# Patient Record
Sex: Male | Born: 1950 | Race: Black or African American | Hispanic: No | Marital: Married | Smoking: Former smoker
Health system: Southern US, Community
[De-identification: ages and names within clinical notes are randomized; demographics above are authoritative.]

## PROBLEM LIST (undated history)

## (undated) DIAGNOSIS — I714 Abdominal aortic aneurysm, without rupture, unspecified: Secondary | ICD-10-CM

## (undated) DIAGNOSIS — I10 Essential (primary) hypertension: Secondary | ICD-10-CM

## (undated) HISTORY — DX: Abdominal aortic aneurysm, without rupture, unspecified: I71.40

## (undated) HISTORY — DX: Abdominal aortic aneurysm, without rupture: I71.4

## (undated) HISTORY — PX: ABDOMINAL SURGERY: SHX537

---

## 2014-08-05 ENCOUNTER — Inpatient Hospital Stay (HOSPITAL_COMMUNITY)
Admission: EM | Admit: 2014-08-05 | Discharge: 2014-08-10 | DRG: 268 | Disposition: A | Discharge status: Home / Self Care | Payer: BC Managed Care – PPO | Attending: Vascular Surgery | Admitting: Vascular Surgery

## 2014-08-05 ENCOUNTER — Emergency Department (HOSPITAL_COMMUNITY): Payer: BC Managed Care – PPO

## 2014-08-05 ENCOUNTER — Encounter (HOSPITAL_COMMUNITY)
Admission: EM | Disposition: A | Discharge status: Home / Self Care | Payer: Self-pay | Source: Home / Self Care | Attending: Vascular Surgery

## 2014-08-05 ENCOUNTER — Encounter (HOSPITAL_COMMUNITY): Payer: Self-pay | Admitting: Emergency Medicine

## 2014-08-05 ENCOUNTER — Ambulatory Visit (HOSPITAL_COMMUNITY): Admit: 2014-08-05 | Payer: Self-pay | Admitting: Interventional Cardiology

## 2014-08-05 DIAGNOSIS — I723 Aneurysm of iliac artery: Secondary | ICD-10-CM | POA: Diagnosis present

## 2014-08-05 DIAGNOSIS — R109 Unspecified abdominal pain: Secondary | ICD-10-CM

## 2014-08-05 DIAGNOSIS — Z419 Encounter for procedure for purposes other than remedying health state, unspecified: Secondary | ICD-10-CM

## 2014-08-05 DIAGNOSIS — I745 Embolism and thrombosis of iliac artery: Secondary | ICD-10-CM | POA: Diagnosis present

## 2014-08-05 DIAGNOSIS — F17210 Nicotine dependence, cigarettes, uncomplicated: Secondary | ICD-10-CM | POA: Diagnosis present

## 2014-08-05 DIAGNOSIS — Z79899 Other long term (current) drug therapy: Secondary | ICD-10-CM

## 2014-08-05 DIAGNOSIS — K661 Hemoperitoneum: Secondary | ICD-10-CM | POA: Diagnosis present

## 2014-08-05 DIAGNOSIS — Z720 Tobacco use: Secondary | ICD-10-CM

## 2014-08-05 DIAGNOSIS — I713 Abdominal aortic aneurysm, ruptured, unspecified: Secondary | ICD-10-CM | POA: Diagnosis present

## 2014-08-05 DIAGNOSIS — I714 Abdominal aortic aneurysm, without rupture: Secondary | ICD-10-CM | POA: Diagnosis present

## 2014-08-05 DIAGNOSIS — N179 Acute kidney failure, unspecified: Secondary | ICD-10-CM | POA: Diagnosis present

## 2014-08-05 LAB — CBC
HCT: 44.5 % (ref 39.0–52.0)
HEMOGLOBIN: 15 g/dL (ref 13.0–17.0)
MCH: 31 pg (ref 26.0–34.0)
MCHC: 33.7 g/dL (ref 30.0–36.0)
MCV: 91.9 fL (ref 78.0–100.0)
Platelets: 223 10*3/uL (ref 150–400)
RBC: 4.84 MIL/uL (ref 4.22–5.81)
RDW: 14.1 % (ref 11.5–15.5)
WBC: 7.7 10*3/uL (ref 4.0–10.5)

## 2014-08-05 LAB — COMPREHENSIVE METABOLIC PANEL
ALT: 28 U/L (ref 0–53)
AST: 25 U/L (ref 0–37)
Albumin: 3.7 g/dL (ref 3.5–5.2)
Alkaline Phosphatase: 80 U/L (ref 39–117)
Anion gap: 14 (ref 5–15)
BUN: 16 mg/dL (ref 6–23)
CO2: 22 meq/L (ref 19–32)
CREATININE: 1.67 mg/dL — AB (ref 0.50–1.35)
Calcium: 9.2 mg/dL (ref 8.4–10.5)
Chloride: 104 mEq/L (ref 96–112)
GFR, EST AFRICAN AMERICAN: 49 mL/min — AB (ref >90)
GFR, EST NON AFRICAN AMERICAN: 42 mL/min — AB (ref >90)
GLUCOSE: 142 mg/dL — AB (ref 70–99)
Potassium: 6.2 mEq/L — ABNORMAL HIGH (ref 3.7–5.3)
Sodium: 140 mEq/L (ref 137–147)
Total Bilirubin: 0.4 mg/dL (ref 0.3–1.2)
Total Protein: 7.1 g/dL (ref 6.0–8.3)

## 2014-08-05 LAB — I-STAT TROPONIN, ED: Troponin i, poc: 0 ng/mL (ref 0.00–0.08)

## 2014-08-05 LAB — URINALYSIS, ROUTINE W REFLEX MICROSCOPIC
Glucose, UA: NEGATIVE mg/dL
HGB URINE DIPSTICK: NEGATIVE
Ketones, ur: 15 mg/dL — AB
NITRITE: NEGATIVE
PROTEIN: 100 mg/dL — AB
Specific Gravity, Urine: 1.027 (ref 1.005–1.030)
UROBILINOGEN UA: 1 mg/dL (ref 0.0–1.0)
pH: 6 (ref 5.0–8.0)

## 2014-08-05 LAB — DIFFERENTIAL
Basophils Absolute: 0 10*3/uL (ref 0.0–0.1)
Basophils Relative: 1 % (ref 0–1)
EOS ABS: 0.2 10*3/uL (ref 0.0–0.7)
EOS PCT: 2 % (ref 0–5)
LYMPHS ABS: 2.5 10*3/uL (ref 0.7–4.0)
Lymphocytes Relative: 33 % (ref 12–46)
MONO ABS: 0.5 10*3/uL (ref 0.1–1.0)
Monocytes Relative: 6 % (ref 3–12)
NEUTROS PCT: 58 % (ref 43–77)
Neutro Abs: 4.6 10*3/uL (ref 1.7–7.7)

## 2014-08-05 LAB — URINE MICROSCOPIC-ADD ON

## 2014-08-05 LAB — I-STAT CHEM 8, ED
BUN: 17 mg/dL (ref 6–23)
CALCIUM ION: 1.13 mmol/L (ref 1.13–1.30)
CREATININE: 1.8 mg/dL — AB (ref 0.50–1.35)
Chloride: 108 mEq/L (ref 96–112)
GLUCOSE: 148 mg/dL — AB (ref 70–99)
HCT: 50 % (ref 39.0–52.0)
Hemoglobin: 17 g/dL (ref 13.0–17.0)
POTASSIUM: 5.9 meq/L — AB (ref 3.7–5.3)
Sodium: 141 mEq/L (ref 137–147)
TCO2: 22 mmol/L (ref 0–100)

## 2014-08-05 SURGERY — LEFT HEART CATHETERIZATION WITH CORONARY ANGIOGRAM
Anesthesia: LOCAL

## 2014-08-05 MED ORDER — OXYCODONE-ACETAMINOPHEN 5-325 MG PO TABS
2.0000 | ORAL_TABLET | Freq: Once | ORAL | Status: AC
  Administered 2014-08-05: 2 via ORAL
  Filled 2014-08-05: qty 2

## 2014-08-05 MED ORDER — SODIUM CHLORIDE 0.9 % IV BOLUS (SEPSIS)
1000.0000 mL | Freq: Once | INTRAVENOUS | Status: AC
Start: 2014-08-05 — End: 2014-08-06
  Administered 2014-08-05: 1000 mL via INTRAVENOUS

## 2014-08-05 NOTE — ED Provider Notes (Signed)
CSN: [id number]     Arrival date & time 08/05/14  1946 History   First MD Initiated Contact with Patient 08/05/14 1953     Chief Complaint  Patient presents with  . Abdominal Pain     (Consider location/radiation/quality/duration/timing/severity/associated sxs/prior Treatment) Patient is a 63 y.o. male presenting with abdominal pain. The history is provided by the patient. No language interpreter was used.  Abdominal Pain Pain location:  Suprapubic Pain quality: sharp   Pain radiates to:  Does not radiate Pain severity:  Severe Onset quality:  Gradual Duration:  1 day Timing:  Constant Progression:  Worsening Chronicity:  New Context: not previous surgeries, not recent illness and not trauma   Relieved by:  Nothing Worsened by:  Nothing tried Ineffective treatments:  None tried Associated symptoms: dysuria (frequency)   Associated symptoms: no anorexia, no chest pain, no constipation, no diarrhea, no fever, no hematemesis, no hematochezia, no hematuria, no melena, no nausea, no shortness of breath and no vomiting   Risk factors: has not had multiple surgeries and no NSAID use     History reviewed. No pertinent past medical history. History reviewed. No pertinent past surgical history. No family history on file. History  Substance Use Topics  . Smoking status: Current Every Day Smoker -- 0.50 packs/day for 20 years    Types: Cigarettes  . Smokeless tobacco: Not on file  . Alcohol Use: No    Review of Systems  Constitutional: Negative for fever.  Eyes: Negative for visual disturbance.  Respiratory: Negative for chest tightness and shortness of breath.   Cardiovascular: Negative for chest pain and leg swelling.  Gastrointestinal: Positive for abdominal pain. Negative for nausea, vomiting, diarrhea, constipation, blood in stool, melena, hematochezia, anorexia and hematemesis.  Genitourinary: Positive for dysuria (frequency). Negative for hematuria, flank pain, discharge,  scrotal swelling and testicular pain.  Musculoskeletal: Negative for arthralgias and gait problem.  Skin: Negative for rash.  Neurological: Negative for dizziness, syncope, weakness, light-headedness, numbness and headaches.  Hematological: Does not bruise/bleed easily.  All other systems reviewed and are negative.     Allergies  Review of patient's allergies indicates no known allergies.  Home Medications   Prior to Admission medications   Not on File   BP 126/92 mmHg  Pulse 92  Temp(Src) 97.7 F (36.5 C) (Oral)  Resp 22  Ht 6\' 3"  (1.905 m)  Wt 285 lb (129.275 kg)  BMI 35.62 kg/m2  SpO2 98% Physical Exam  Constitutional: He is oriented to person, place, and time. He appears well-developed and well-nourished. No distress.  HENT:  Head: Normocephalic.  Mouth/Throat: Oropharynx is clear and moist.  Eyes: EOM are normal. Pupils are equal, round, and reactive to light.  Neck: No JVD present.  Cardiovascular: Normal rate, regular rhythm, normal heart sounds and intact distal pulses.   Pulmonary/Chest: Effort normal and breath sounds normal. He exhibits no tenderness.  Abdominal: Soft. Bowel sounds are normal. He exhibits distension. There is tenderness in the suprapubic area. There is no rigidity, no rebound, no guarding and no CVA tenderness. No hernia. Hernia confirmed negative in the right inguinal area and confirmed negative in the left inguinal area.  Genitourinary: Testes normal and penis normal.  Neurological: He is alert and oriented to person, place, and time. He has normal strength. No sensory deficit.  Skin: Skin is warm and dry.  Vitals reviewed.   ED Course  Procedures (including critical care time) Labs Review Labs Reviewed  I-STAT CHEM 8, ED - Abnormal; Notable  for the following:    Potassium 5.9 (*)    Creatinine, Ser 1.80 (*)    Glucose, Bld 148 (*)    All other components within normal limits  CBC  DIFFERENTIAL  COMPREHENSIVE METABOLIC PANEL   URINALYSIS, ROUTINE W REFLEX MICROSCOPIC  I-STAT TROPOININ, ED    Imaging Review Ct Renal Stone Study  08/06/2014   ADDENDUM REPORT: 08/06/2014 00:09  ADDENDUM: It should be noted the infrarenal neck measures approximately 2.4 x 2.5 cm. The initial aneurysmal dilatation arises approximately 13 mm below the renal arteries.   Electronically Signed   By: Alcide CleverMark  Lukens M.D.   On: 08/06/2014 00:09   08/06/2014   CLINICAL DATA:  Periumbilical abdominal pain without hematuria  EXAM: CT ABDOMEN AND PELVIS WITHOUT CONTRAST  TECHNIQUE: Multidetector CT imaging of the abdomen and pelvis was performed following the standard protocol without IV contrast.  COMPARISON:  None.  FINDINGS: Lung bases show mild dependent atelectasis. No focal infiltrate is seen.  The liver, gallbladder, spleen, adrenal glands and pancreas are within normal limits with the exception of a few small hypodensities within the liver consistent with cysts.  Small amount of free fluid is noted surrounding the dome of the liver. Kidneys demonstrate no renal calculi or obstructive changes.  There is tortuosity of the abdominal aorta and evidence of a large infrarenal abdominal aortic aneurysm which measures 6.7 by 6.3 cm in greatest AP and transverse dimensions respectively. This extends to the level of the AA iliac bifurcation and there is aneurysmal dilatation of both common iliac arteries. On the left the common iliac artery measures 6.6 cm in greatest dimension and on the right 6.5 cm in greatest dimension. Evaluation of lumen is difficult with the lack of IV contrast. A significant amount of both retroperitoneal and intraperitoneal hemorrhage is noted consistent with acute aortic rupture. This likely occurred in the right lateral wall of the aorta although evaluation is difficult. The external iliac arteries bilaterally show calcific changes but are not aneurysmally dilated.  The bladder is partially distended. No pelvic mass lesion is seen. Mild  diverticular change is noted. The osseous structures show no acute abnormality.  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 There are changes consistent with rupture and retroperitoneal hemorrhage a small amount of free peritoneal fluid is noted likely related to the hemorrhage as well.  Critical Value/emergent results were called by telephone at the time of interpretation on 08/05/2014 at 11:48 pm to Dr. Abagail KitchensMEGAN Shatima Zalar , who verbally acknowledged these results.  Electronically Signed: By: Alcide CleverMark  Lukens M.D. On: 08/05/2014 23:48     EKG Interpretation   Date/Time:  Wednesday August 05 2014 19:50:57 EST Ventricular Rate:  86 PR Interval:  136 QRS Duration: 81 QT Interval:  414 QTC Calculation: 495 R Axis:   -13 Text Interpretation:  Sinus rhythm Left ventricular hypertrophy Borderline  prolonged QT interval Abnormal ekg Confirmed by BEATON  MD, ROBERT ([id number])  on 08/05/2014 7:54:50 PM      MDM   Final diagnoses:  Abdominal pain  AAA (abdominal aortic aneurysm, ruptured)    63 y/o male with suprapubic abdominal pain x 1 day. No additional abdominal symptoms and benign exam. Found to have AKI. Unknown prior renal function as pt has not seen healthcare provider in 20 years but no h/o renal disease. K elevated, without EKG changes. Will get CT.   CT with large ruptured AAA. Pt returned from CT, BP remain stable. Vascular (Dr. Hart RochesterLawson) surgery consulted. Will  continue to monitor closely.   12:43 AM Pt transported to OR.     Abagail Kitchens, MD 08/06/14 [id number]  Nelia Shi, MD 08/13/14 [phone]

## 2014-08-05 NOTE — ED Notes (Signed)
STEMI canceled. 

## 2014-08-05 NOTE — ED Notes (Signed)
Dr Beaton at bedside 

## 2014-08-05 NOTE — ED Notes (Signed)
Per EMS: pt from home for eval of 8/10 abdominal pain, EMS noted pt to be diaphoretic upon arrival pt denies any cp, sob or n/v/d. Initial EKG with EMS noted to be unremarkable, second EKG noted to show ST elevation. Dr. Radford PaxBeaton at bedside, repeat EKG done. 50 mcg of fentanyl done en route with relief. Pain 5/10

## 2014-08-05 NOTE — ED Notes (Addendum)
CT called for update on pt transport to Ct, states pt is on the list to be transported shortly.

## 2014-08-06 ENCOUNTER — Inpatient Hospital Stay (HOSPITAL_COMMUNITY): Payer: BC Managed Care – PPO

## 2014-08-06 ENCOUNTER — Emergency Department (HOSPITAL_COMMUNITY): Payer: BC Managed Care – PPO | Admitting: Certified Registered Nurse Anesthetist

## 2014-08-06 ENCOUNTER — Encounter (HOSPITAL_COMMUNITY)
Admission: EM | Disposition: A | Discharge status: Home / Self Care | Payer: Self-pay | Source: Home / Self Care | Attending: Vascular Surgery

## 2014-08-06 DIAGNOSIS — Z720 Tobacco use: Secondary | ICD-10-CM | POA: Diagnosis not present

## 2014-08-06 DIAGNOSIS — I714 Abdominal aortic aneurysm, without rupture: Secondary | ICD-10-CM | POA: Diagnosis present

## 2014-08-06 DIAGNOSIS — Z79899 Other long term (current) drug therapy: Secondary | ICD-10-CM | POA: Diagnosis not present

## 2014-08-06 DIAGNOSIS — I713 Abdominal aortic aneurysm, ruptured, unspecified: Secondary | ICD-10-CM | POA: Diagnosis present

## 2014-08-06 DIAGNOSIS — R109 Unspecified abdominal pain: Secondary | ICD-10-CM | POA: Diagnosis present

## 2014-08-06 DIAGNOSIS — Z9889 Other specified postprocedural states: Secondary | ICD-10-CM

## 2014-08-06 DIAGNOSIS — I723 Aneurysm of iliac artery: Secondary | ICD-10-CM | POA: Diagnosis present

## 2014-08-06 DIAGNOSIS — F17210 Nicotine dependence, cigarettes, uncomplicated: Secondary | ICD-10-CM | POA: Diagnosis present

## 2014-08-06 DIAGNOSIS — I745 Embolism and thrombosis of iliac artery: Secondary | ICD-10-CM | POA: Diagnosis present

## 2014-08-06 DIAGNOSIS — N179 Acute kidney failure, unspecified: Secondary | ICD-10-CM | POA: Diagnosis present

## 2014-08-06 DIAGNOSIS — K661 Hemoperitoneum: Secondary | ICD-10-CM | POA: Diagnosis present

## 2014-08-06 HISTORY — PX: ABDOMINAL AORTIC ENDOVASCULAR STENT GRAFT: SHX5707

## 2014-08-06 LAB — BASIC METABOLIC PANEL
Anion gap: 14 (ref 5–15)
BUN: 16 mg/dL (ref 6–23)
CALCIUM: 8.2 mg/dL — AB (ref 8.4–10.5)
CO2: 20 mEq/L (ref 19–32)
Chloride: 106 mEq/L (ref 96–112)
Creatinine, Ser: 1.36 mg/dL — ABNORMAL HIGH (ref 0.50–1.35)
GFR, EST AFRICAN AMERICAN: 62 mL/min — AB (ref >90)
GFR, EST NON AFRICAN AMERICAN: 54 mL/min — AB (ref >90)
Glucose, Bld: 155 mg/dL — ABNORMAL HIGH (ref 70–99)
Potassium: 5.5 mEq/L — ABNORMAL HIGH (ref 3.7–5.3)
Sodium: 140 mEq/L (ref 137–147)

## 2014-08-06 LAB — CBC
HCT: 36.7 % — ABNORMAL LOW (ref 39.0–52.0)
Hemoglobin: 12.4 g/dL — ABNORMAL LOW (ref 13.0–17.0)
MCH: 31.6 pg (ref 26.0–34.0)
MCHC: 33.8 g/dL (ref 30.0–36.0)
MCV: 93.6 fL (ref 78.0–100.0)
Platelets: 161 10*3/uL (ref 150–400)
RBC: 3.92 MIL/uL — ABNORMAL LOW (ref 4.22–5.81)
RDW: 14.4 % (ref 11.5–15.5)
WBC: 10 10*3/uL (ref 4.0–10.5)

## 2014-08-06 LAB — PROTIME-INR
INR: 1.2 (ref 0.00–1.49)
PROTHROMBIN TIME: 15.4 s — AB (ref 11.6–15.2)

## 2014-08-06 LAB — PREPARE RBC (CROSSMATCH)

## 2014-08-06 LAB — APTT: aPTT: 25 seconds (ref 24–37)

## 2014-08-06 LAB — MAGNESIUM: Magnesium: 2 mg/dL (ref 1.5–2.5)

## 2014-08-06 LAB — ABO/RH: ABO/RH(D): O POS

## 2014-08-06 LAB — MRSA PCR SCREENING: MRSA by PCR: NEGATIVE

## 2014-08-06 IMAGING — CR DG ABD PORTABLE 1V
1 series · 1 of 1 positions shown · non-contrast
Comparison: CT [DATE]

CLINICAL DATA: Postop stent placement and line placement.

EXAM:
PORTABLE ABDOMEN - 1 VIEW

[AP]
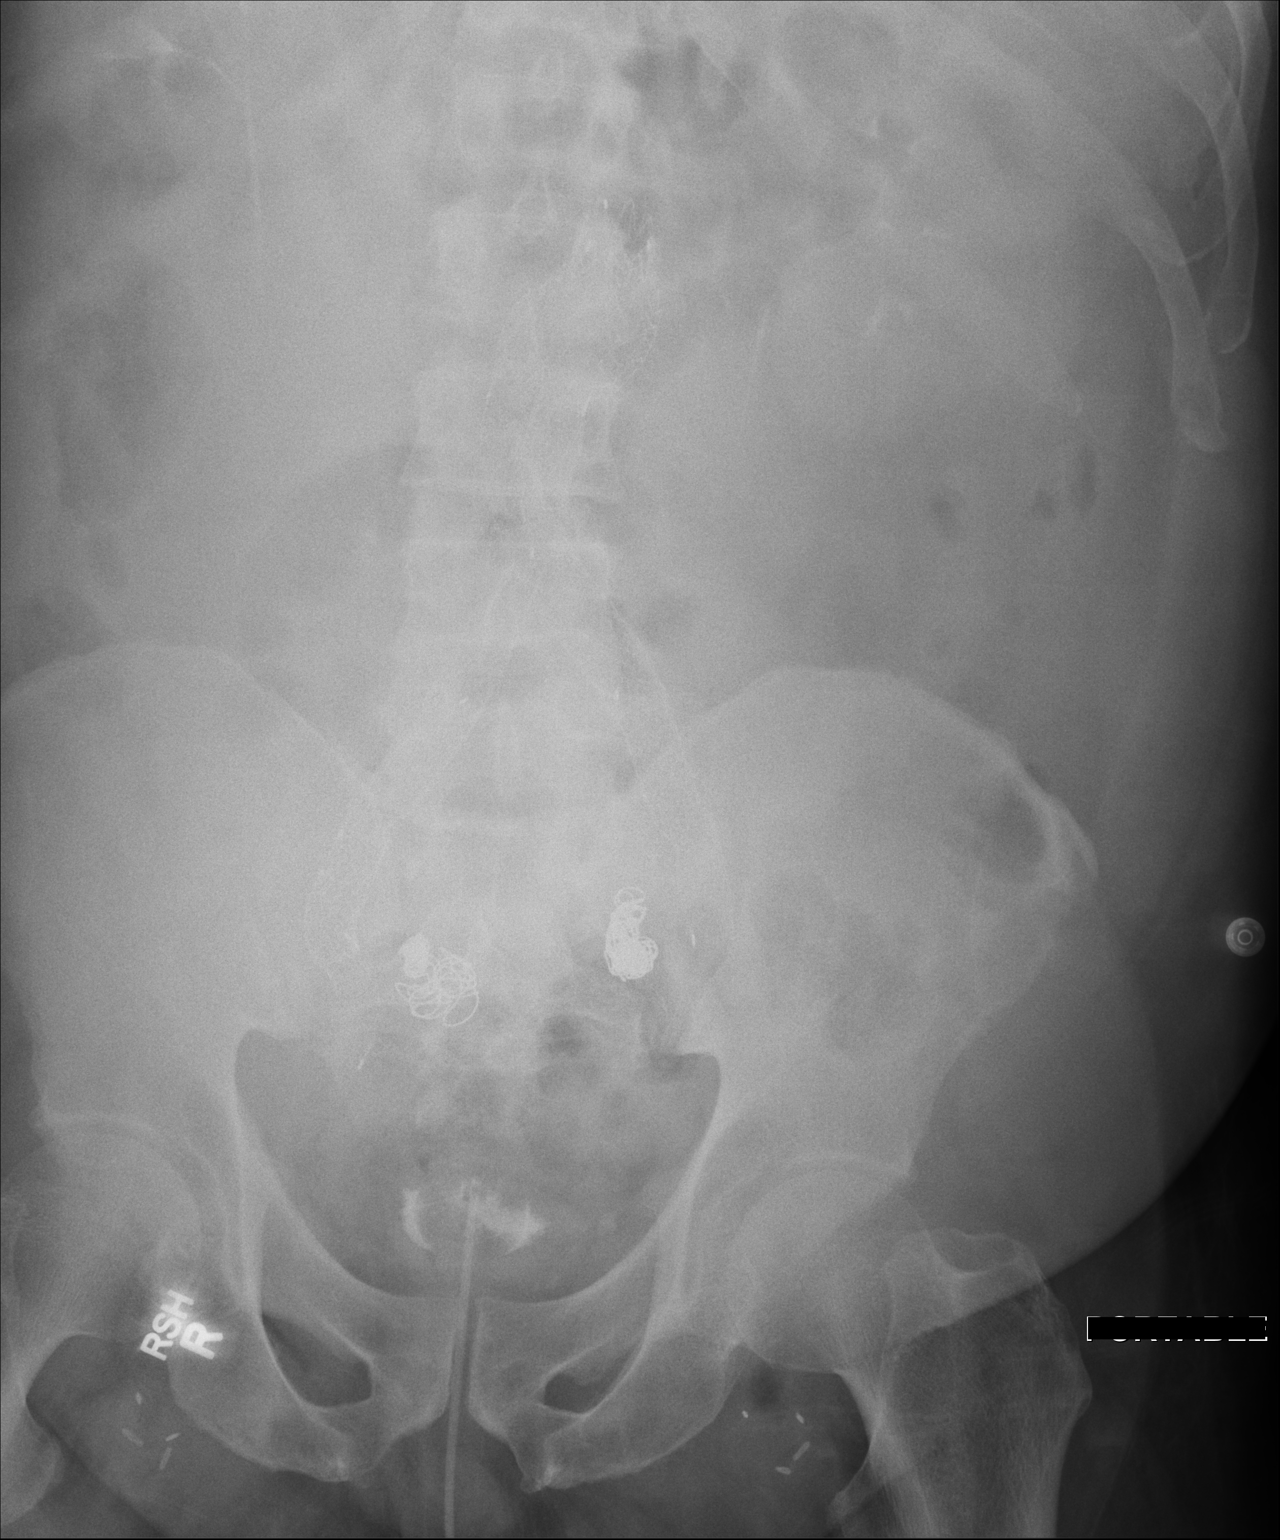

[1 of 1 positions shown; findings below may reference images not displayed]

FINDINGS: Examination demonstrates a nonobstructive bowel gas pattern. There
is evidence of patient's recently placed aortic and bi-iliac stent
graft. Linear coiled radiopaque material is present over the upper
pelvis bilaterally likely related to patient's recent vascular
surgery. Contrast is present within the intrarenal collecting
systems and bladder. A catheter is present over the midline pelvis.
There are minimal degenerative changes of the spine and hips.
IMPRESSION: Nonobstructive bowel gas pattern.

Aortic and bi-iliac stent graft.

## 2014-08-06 SURGERY — INSERTION, ENDOVASCULAR STENT GRAFT, AORTA, ABDOMINAL
Anesthesia: General | Site: Abdomen

## 2014-08-06 MED ORDER — ROCURONIUM BROMIDE 100 MG/10ML IV SOLN
INTRAVENOUS | Status: DC | PRN
  Administered 2014-08-06 (×5): 10 mg via INTRAVENOUS
  Administered 2014-08-06: 20 mg via INTRAVENOUS
  Administered 2014-08-06: 40 mg via INTRAVENOUS
  Administered 2014-08-06 (×4): 10 mg via INTRAVENOUS

## 2014-08-06 MED ORDER — FENTANYL CITRATE 0.05 MG/ML IJ SOLN
INTRAMUSCULAR | Status: AC
  Filled 2014-08-06: qty 5

## 2014-08-06 MED ORDER — GUAIFENESIN-DM 100-10 MG/5ML PO SYRP: 15.0000 mL | ORAL_SOLUTION | ORAL | Status: DC | PRN

## 2014-08-06 MED ORDER — DEXAMETHASONE SODIUM PHOSPHATE 4 MG/ML IJ SOLN
INTRAMUSCULAR | Status: AC
  Filled 2014-08-06: qty 2

## 2014-08-06 MED ORDER — NEOSTIGMINE METHYLSULFATE 10 MG/10ML IV SOLN
INTRAVENOUS | Status: AC
  Filled 2014-08-06: qty 1

## 2014-08-06 MED ORDER — ONDANSETRON HCL 4 MG/2ML IJ SOLN: 4.0000 mg | Freq: Four times a day (QID) | INTRAMUSCULAR | Status: DC | PRN

## 2014-08-06 MED ORDER — PROPOFOL 10 MG/ML IV BOLUS
INTRAVENOUS | Status: AC
  Filled 2014-08-06: qty 20

## 2014-08-06 MED ORDER — NEOSTIGMINE METHYLSULFATE 10 MG/10ML IV SOLN
INTRAVENOUS | Status: DC | PRN
  Administered 2014-08-06: 5 mg via INTRAVENOUS

## 2014-08-06 MED ORDER — PROPOFOL 10 MG/ML IV BOLUS
INTRAVENOUS | Status: DC | PRN
  Administered 2014-08-06: 130 mg via INTRAVENOUS

## 2014-08-06 MED ORDER — MORPHINE SULFATE 2 MG/ML IJ SOLN
2.0000 mg | INTRAMUSCULAR | Status: DC | PRN
  Administered 2014-08-07 (×2): 4 mg via INTRAVENOUS
  Filled 2014-08-06 (×2): qty 2

## 2014-08-06 MED ORDER — ACETAMINOPHEN 325 MG PO TABS: 325.0000 mg | ORAL_TABLET | ORAL | Status: DC | PRN

## 2014-08-06 MED ORDER — HYDRALAZINE HCL 20 MG/ML IJ SOLN
5.0000 mg | INTRAMUSCULAR | Status: DC | PRN
  Administered 2014-08-06: 5 mg via INTRAVENOUS
  Filled 2014-08-06: qty 1

## 2014-08-06 MED ORDER — 0.9 % SODIUM CHLORIDE (POUR BTL) OPTIME
TOPICAL | Status: DC | PRN
  Administered 2014-08-06: 2000 mL

## 2014-08-06 MED ORDER — LIDOCAINE HCL (CARDIAC) 20 MG/ML IV SOLN
INTRAVENOUS | Status: AC
  Filled 2014-08-06: qty 5

## 2014-08-06 MED ORDER — ONDANSETRON HCL 4 MG/2ML IJ SOLN
INTRAMUSCULAR | Status: DC | PRN
  Administered 2014-08-06: 4 mg via INTRAVENOUS

## 2014-08-06 MED ORDER — LABETALOL HCL 5 MG/ML IV SOLN
10.0000 mg | INTRAVENOUS | Status: DC | PRN
  Filled 2014-08-06: qty 4

## 2014-08-06 MED ORDER — ROCURONIUM BROMIDE 50 MG/5ML IV SOLN
INTRAVENOUS | Status: AC
  Filled 2014-08-06: qty 1

## 2014-08-06 MED ORDER — HEPARIN SODIUM (PORCINE) 1000 UNIT/ML IJ SOLN
INTRAMUSCULAR | Status: AC
  Filled 2014-08-06: qty 1

## 2014-08-06 MED ORDER — DEXTROSE-NACL 5-0.45 % IV SOLN
INTRAVENOUS | Status: DC
  Administered 2014-08-06: 09:00:00 via INTRAVENOUS
  Administered 2014-08-06: 100 mL/h via INTRAVENOUS

## 2014-08-06 MED ORDER — SODIUM CHLORIDE 0.9 % IV SOLN: 500.0000 mL | Freq: Once | INTRAVENOUS | Status: AC | PRN

## 2014-08-06 MED ORDER — PANTOPRAZOLE SODIUM 40 MG PO TBEC
40.0000 mg | DELAYED_RELEASE_TABLET | Freq: Every day | ORAL | Status: DC
  Administered 2014-08-06 – 2014-08-10 (×5): 40 mg via ORAL
  Filled 2014-08-06 (×5): qty 1

## 2014-08-06 MED ORDER — LABETALOL HCL 5 MG/ML IV SOLN
INTRAVENOUS | Status: DC | PRN
  Administered 2014-08-06: 5 mg via INTRAVENOUS

## 2014-08-06 MED ORDER — CHLORHEXIDINE GLUCONATE 0.12 % MT SOLN
15.0000 mL | Freq: Two times a day (BID) | OROMUCOSAL | Status: DC
  Administered 2014-08-06 (×2): 15 mL via OROMUCOSAL
  Filled 2014-08-06 (×2): qty 15

## 2014-08-06 MED ORDER — POTASSIUM CHLORIDE CRYS ER 20 MEQ PO TBCR: 20.0000 meq | EXTENDED_RELEASE_TABLET | Freq: Every day | ORAL | Status: DC | PRN

## 2014-08-06 MED ORDER — LABETALOL HCL 5 MG/ML IV SOLN
INTRAVENOUS | Status: AC
  Filled 2014-08-06: qty 4

## 2014-08-06 MED ORDER — OXYCODONE HCL 5 MG PO TABS: 5.0000 mg | ORAL_TABLET | Freq: Once | ORAL | Status: DC | PRN

## 2014-08-06 MED ORDER — IODIXANOL 320 MG/ML IV SOLN
INTRAVENOUS | Status: DC | PRN
Start: 2014-08-06 — End: 2014-08-06
  Administered 2014-08-06: 8.8 mL via INTRAVENOUS

## 2014-08-06 MED ORDER — CEFAZOLIN SODIUM-DEXTROSE 2-3 GM-% IV SOLR
INTRAVENOUS | Status: AC
  Filled 2014-08-06: qty 50

## 2014-08-06 MED ORDER — ENOXAPARIN SODIUM 30 MG/0.3ML ~~LOC~~ SOLN
30.0000 mg | SUBCUTANEOUS | Status: DC
  Administered 2014-08-07 – 2014-08-10 (×4): 30 mg via SUBCUTANEOUS
  Filled 2014-08-06 (×5): qty 0.3

## 2014-08-06 MED ORDER — MAGNESIUM SULFATE 2 GM/50ML IV SOLN
2.0000 g | Freq: Every day | INTRAVENOUS | Status: DC | PRN
  Filled 2014-08-06: qty 50

## 2014-08-06 MED ORDER — HYDROMORPHONE HCL 1 MG/ML IJ SOLN: 0.2500 mg | INTRAMUSCULAR | Status: DC | PRN

## 2014-08-06 MED ORDER — SUCCINYLCHOLINE CHLORIDE 20 MG/ML IJ SOLN
INTRAMUSCULAR | Status: AC
  Filled 2014-08-06: qty 1

## 2014-08-06 MED ORDER — SUCCINYLCHOLINE CHLORIDE 20 MG/ML IJ SOLN
INTRAMUSCULAR | Status: DC | PRN
  Administered 2014-08-06: 130 mg via INTRAVENOUS

## 2014-08-06 MED ORDER — GLYCOPYRROLATE 0.2 MG/ML IJ SOLN
INTRAMUSCULAR | Status: AC
  Filled 2014-08-06: qty 3

## 2014-08-06 MED ORDER — PHENOL 1.4 % MT LIQD: 1.0000 | OROMUCOSAL | Status: DC | PRN

## 2014-08-06 MED ORDER — IODIXANOL 320 MG/ML IV SOLN
INTRAVENOUS | Status: DC | PRN
  Administered 2014-08-06 (×2): 100 mL via INTRAVENOUS

## 2014-08-06 MED ORDER — CETYLPYRIDINIUM CHLORIDE 0.05 % MT LIQD
7.0000 mL | Freq: Two times a day (BID) | OROMUCOSAL | Status: DC
  Administered 2014-08-06 (×2): 7 mL via OROMUCOSAL

## 2014-08-06 MED ORDER — FENTANYL CITRATE 0.05 MG/ML IJ SOLN
INTRAMUSCULAR | Status: DC | PRN
  Administered 2014-08-06 (×8): 50 ug via INTRAVENOUS

## 2014-08-06 MED ORDER — OXYCODONE HCL 5 MG/5ML PO SOLN: 5.0000 mg | Freq: Once | ORAL | Status: DC | PRN

## 2014-08-06 MED ORDER — DEXTROSE 5 % IV SOLN
1.5000 g | Freq: Two times a day (BID) | INTRAVENOUS | Status: AC
  Administered 2014-08-06 – 2014-08-07 (×2): 1.5 g via INTRAVENOUS
  Filled 2014-08-06 (×2): qty 1.5

## 2014-08-06 MED ORDER — DEXAMETHASONE SODIUM PHOSPHATE 4 MG/ML IJ SOLN
INTRAMUSCULAR | Status: DC | PRN
  Administered 2014-08-06: 8 mg via INTRAVENOUS

## 2014-08-06 MED ORDER — LACTATED RINGERS IV SOLN
INTRAVENOUS | Status: DC | PRN
  Administered 2014-08-06 (×3): via INTRAVENOUS

## 2014-08-06 MED ORDER — PHENYLEPHRINE HCL 10 MG/ML IJ SOLN
10.0000 mg | INTRAVENOUS | Status: DC | PRN
  Administered 2014-08-06: 100 ug/min via INTRAVENOUS

## 2014-08-06 MED ORDER — DOCUSATE SODIUM 100 MG PO CAPS
100.0000 mg | ORAL_CAPSULE | Freq: Every day | ORAL | Status: DC
  Administered 2014-08-07 – 2014-08-10 (×4): 100 mg via ORAL
  Filled 2014-08-06 (×4): qty 1

## 2014-08-06 MED ORDER — ARTIFICIAL TEARS OP OINT
TOPICAL_OINTMENT | OPHTHALMIC | Status: AC
  Filled 2014-08-06: qty 3.5

## 2014-08-06 MED ORDER — MIDAZOLAM HCL 5 MG/5ML IJ SOLN
INTRAMUSCULAR | Status: DC | PRN
  Administered 2014-08-06: 1 mg via INTRAVENOUS

## 2014-08-06 MED ORDER — GLYCOPYRROLATE 0.2 MG/ML IJ SOLN
INTRAMUSCULAR | Status: DC | PRN
  Administered 2014-08-06: .7 mg via INTRAVENOUS

## 2014-08-06 MED ORDER — ONDANSETRON HCL 4 MG/2ML IJ SOLN
INTRAMUSCULAR | Status: AC
  Filled 2014-08-06: qty 2

## 2014-08-06 MED ORDER — ARTIFICIAL TEARS OP OINT
TOPICAL_OINTMENT | OPHTHALMIC | Status: DC | PRN
  Administered 2014-08-06: 1 via OPHTHALMIC

## 2014-08-06 MED ORDER — PROTAMINE SULFATE 10 MG/ML IV SOLN
INTRAVENOUS | Status: DC | PRN
  Administered 2014-08-06: 50 mg via INTRAVENOUS

## 2014-08-06 MED ORDER — SODIUM CHLORIDE 0.9 % IR SOLN
Status: DC | PRN
  Administered 2014-08-06 (×2): 500 mL

## 2014-08-06 MED ORDER — METOPROLOL TARTRATE 1 MG/ML IV SOLN: 2.0000 mg | INTRAVENOUS | Status: DC | PRN

## 2014-08-06 MED ORDER — ACETAMINOPHEN 650 MG RE SUPP: 325.0000 mg | RECTAL | Status: DC | PRN

## 2014-08-06 MED ORDER — OXYCODONE HCL 5 MG PO TABS
5.0000 mg | ORAL_TABLET | ORAL | Status: DC | PRN
  Administered 2014-08-07 (×2): 10 mg via ORAL
  Filled 2014-08-06 (×2): qty 2

## 2014-08-06 MED ORDER — HEPARIN SODIUM (PORCINE) 1000 UNIT/ML IJ SOLN
INTRAMUSCULAR | Status: DC | PRN
  Administered 2014-08-06: 2000 [IU] via INTRAVENOUS
  Administered 2014-08-06: 8000 [IU] via INTRAVENOUS
  Administered 2014-08-06 (×2): 2000 [IU] via INTRAVENOUS

## 2014-08-06 MED ORDER — CEFAZOLIN SODIUM-DEXTROSE 2-3 GM-% IV SOLR
INTRAVENOUS | Status: DC | PRN
  Administered 2014-08-06 (×2): 2 g via INTRAVENOUS

## 2014-08-06 MED ORDER — ALUM & MAG HYDROXIDE-SIMETH 200-200-20 MG/5ML PO SUSP: 15.0000 mL | ORAL | Status: DC | PRN

## 2014-08-06 MED ORDER — MIDAZOLAM HCL 2 MG/2ML IJ SOLN
INTRAMUSCULAR | Status: AC
  Filled 2014-08-06: qty 2

## 2014-08-06 SURGICAL SUPPLY — 107 items
BAG BANDED W/RUBBER/TAPE 36X54 (MISCELLANEOUS) ×3 IMPLANT
BAG SNAP BAND KOVER 36X36 (MISCELLANEOUS) ×3 IMPLANT
CANISTER SUCTION 2500CC (MISCELLANEOUS) ×3 IMPLANT
CATH BEACON 5 .035 65 C1 TIP (CATHETERS) ×3 IMPLANT
CATH BEACON 5 .035 65 VANSC3 (CATHETERS) ×3 IMPLANT
CATH BEACON 5.038 65CM KMP-01 (CATHETERS) ×3 IMPLANT
CATH EMB 4FR 80CM (CATHETERS) ×3 IMPLANT
CATH EMB 5FR 80CM (CATHETERS) ×3 IMPLANT
CATH OMNI FLUSH .035X70CM (CATHETERS) ×3 IMPLANT
CATH QUICKCROSS SUPP .035X90CM (MICROCATHETER) ×3 IMPLANT
CATH SOFT-VU 4F 65 STRAIGHT (CATHETERS) ×1 IMPLANT
CATH SOFT-VU STRAIGHT 4F 65CM (CATHETERS) ×2
CATH SOFTOUCH MOTARJEME 5F (CATHETERS) ×3 IMPLANT
CATH VANSCH 5FR 6CM (CATHETERS) ×3 IMPLANT
CLIP TI MEDIUM 24 (CLIP) ×3 IMPLANT
CLIP TI WIDE RED SMALL 24 (CLIP) ×3 IMPLANT
COIL NESTER 14X12 (Vascular Products) ×24 IMPLANT
COIL NESTER 14X8 (Vascular Products) ×9 IMPLANT
COVER DOME SNAP 22 D (MISCELLANEOUS) ×3 IMPLANT
COVER MAYO STAND STRL (DRAPES) ×3 IMPLANT
COVER PROBE W GEL 5X96 (DRAPES) ×3 IMPLANT
COVER SURGICAL LIGHT HANDLE (MISCELLANEOUS) ×3 IMPLANT
DERMABOND ADVANCED (GAUZE/BANDAGES/DRESSINGS) ×4
DERMABOND ADVANCED .7 DNX12 (GAUZE/BANDAGES/DRESSINGS) ×2 IMPLANT
DEVICE CLOSURE PERCLS PRGLD 6F (VASCULAR PRODUCTS) ×2 IMPLANT
DEVICE TORQUE KENDALL .025-038 (MISCELLANEOUS) ×3 IMPLANT
DRAPE TABLE COVER HEAVY DUTY (DRAPES) ×3 IMPLANT
DRSG TEGADERM 2-3/8X2-3/4 SM (GAUZE/BANDAGES/DRESSINGS) ×6 IMPLANT
DRYSEAL FLEXSHEATH 12FR 33CM (SHEATH) ×2
ELECT CAUTERY BLADE 6.4 (BLADE) ×9 IMPLANT
ELECT REM PT RETURN 9FT ADLT (ELECTROSURGICAL) ×6
ELECTRODE REM PT RTRN 9FT ADLT (ELECTROSURGICAL) ×2 IMPLANT
EXCLUDER TNK LEG 35MX14X14 (Endovascular Graft) ×1 IMPLANT
EXCLUDER TRUNK LEG 35MX14X14 (Endovascular Graft) ×3 IMPLANT
GAUZE SPONGE 2X2 8PLY STRL LF (GAUZE/BANDAGES/DRESSINGS) ×1 IMPLANT
GLIDEWIRE STIFF .35X180X3 HYDR (WIRE) ×3 IMPLANT
GLOVE BIO SURGEON STRL SZ 6.5 (GLOVE) ×2 IMPLANT
GLOVE BIO SURGEONS STRL SZ 6.5 (GLOVE) ×1
GLOVE BIOGEL PI IND STRL 6.5 (GLOVE) ×6 IMPLANT
GLOVE BIOGEL PI IND STRL 7.5 (GLOVE) ×1 IMPLANT
GLOVE BIOGEL PI INDICATOR 6.5 (GLOVE) ×12
GLOVE BIOGEL PI INDICATOR 7.5 (GLOVE) ×2
GLOVE SS BIOGEL STRL SZ 7 (GLOVE) ×1 IMPLANT
GLOVE SS BIOGEL STRL SZ 7.5 (GLOVE) ×1 IMPLANT
GLOVE SUPERSENSE BIOGEL SZ 7 (GLOVE) ×2
GLOVE SUPERSENSE BIOGEL SZ 7.5 (GLOVE) ×2
GLOVE SURG SS PI 7.5 STRL IVOR (GLOVE) ×6 IMPLANT
GOWN STRL REUS W/ TWL LRG LVL3 (GOWN DISPOSABLE) ×3 IMPLANT
GOWN STRL REUS W/TWL LRG LVL3 (GOWN DISPOSABLE) ×6
GRAFT BALLN CATH 65CM (STENTS) ×1 IMPLANT
GRAFT EXCLUDER LEG 12X10 (Endovascular Graft) ×3 IMPLANT
GRAFT EXCLUDER LEG 12X14 (Endovascular Graft) ×6 IMPLANT
GRAFT EXCLUDER LEG 16X12 (Endovascular Graft) ×3 IMPLANT
GUIDEWIRE AMPLATZ STIFF 0.35 (WIRE) ×6 IMPLANT
GUIDEWIRE ANGLED .035X150CM (WIRE) ×3 IMPLANT
INSERT FOGARTY 61MM (MISCELLANEOUS) IMPLANT
INSERT FOGARTY SM (MISCELLANEOUS) IMPLANT
KIT BASIN OR (CUSTOM PROCEDURE TRAY) ×3 IMPLANT
KIT ROOM TURNOVER OR (KITS) ×3 IMPLANT
MARKER SKIN DUAL TIP RULER LAB (MISCELLANEOUS) ×3 IMPLANT
NEEDLE PERC 18GX7CM (NEEDLE) ×3 IMPLANT
NS IRRIG 1000ML POUR BTL (IV SOLUTION) ×3 IMPLANT
PACK AORTA (CUSTOM PROCEDURE TRAY) ×3 IMPLANT
PAD ARMBOARD 7.5X6 YLW CONV (MISCELLANEOUS) ×6 IMPLANT
PENCIL BUTTON HOLSTER BLD 10FT (ELECTRODE) ×6 IMPLANT
PERCLOSE PROGLIDE 6F (VASCULAR PRODUCTS) ×6
PLUG VASCULAR AMPLATZER 14MM (Vascular Products) IMPLANT
PROBE PENCIL 8 MHZ STRL DISP (MISCELLANEOUS) ×3 IMPLANT
PROTECTION STATION PRESSURIZED (MISCELLANEOUS) ×3
SHEATH AVANTI 11CM 8FR (MISCELLANEOUS) ×3 IMPLANT
SHEATH BRITE TIP 8FR 23CM (MISCELLANEOUS) ×3 IMPLANT
SHEATH DRYSEAL FLEX 12FR 33CM (SHEATH) ×1 IMPLANT
SHEATH DRYSEAL GORE 20X28 (SHEATH) ×3 IMPLANT
SPONGE GAUZE 2X2 STER 10/PKG (GAUZE/BANDAGES/DRESSINGS) ×2
STAPLER VISISTAT 35W (STAPLE) ×3 IMPLANT
STATION PROTECTION PRESSURIZED (MISCELLANEOUS) ×1 IMPLANT
STENT GRAFT BALLN CATH 65CM (STENTS) ×2
STOPCOCK MORSE 400PSI 3WAY (MISCELLANEOUS) ×3 IMPLANT
SUT PROLENE 5 0 C 1 24 (SUTURE) ×3 IMPLANT
SUT PROLENE 5 0 CC 1 (SUTURE) IMPLANT
SUT PROLENE 6 0 BV (SUTURE) ×6 IMPLANT
SUT PROLENE 6 0 C 1 30 (SUTURE) IMPLANT
SUT SILK 2 0 (SUTURE) ×2
SUT SILK 2-0 18XBRD TIE 12 (SUTURE) ×1 IMPLANT
SUT SILK 3 0 (SUTURE) ×2
SUT SILK 3-0 18XBRD TIE 12 (SUTURE) ×1 IMPLANT
SUT SILK 4 0 (SUTURE) ×2
SUT SILK 4-0 18XBRD TIE 12 (SUTURE) ×1 IMPLANT
SUT VIC AB 2-0 CT1 27 (SUTURE) ×4
SUT VIC AB 2-0 CT1 TAPERPNT 27 (SUTURE) ×2 IMPLANT
SUT VIC AB 3-0 SH 27 (SUTURE) ×4
SUT VIC AB 3-0 SH 27X BRD (SUTURE) ×2 IMPLANT
SUT VICRYL 4-0 PS2 18IN ABS (SUTURE) ×6 IMPLANT
SYR 20CC LL (SYRINGE) ×6 IMPLANT
SYR 30ML LL (SYRINGE) ×3 IMPLANT
SYR 3ML LL SCALE MARK (SYRINGE) ×3 IMPLANT
SYR 5ML LL (SYRINGE) IMPLANT
SYR MEDRAD MARK V 150ML (SYRINGE) ×3 IMPLANT
SYRINGE 10CC LL (SYRINGE) ×6 IMPLANT
TOWEL OR 17X24 6PK STRL BLUE (TOWEL DISPOSABLE) ×6 IMPLANT
TOWEL OR 17X26 10 PK STRL BLUE (TOWEL DISPOSABLE) ×6 IMPLANT
TRAY FOLEY CATH 16FRSI W/METER (SET/KITS/TRAYS/PACK) ×6 IMPLANT
TUBE CONNECTING 12'X1/4 (SUCTIONS) ×1
TUBE CONNECTING 12X1/4 (SUCTIONS) ×2 IMPLANT
TUBING HIGH PRESSURE 120CM (CONNECTOR) ×6 IMPLANT
WIRE AMPLATZ SS-J .035X180CM (WIRE) IMPLANT
WIRE BENTSON .035X145CM (WIRE) IMPLANT

## 2014-08-06 NOTE — Plan of Care (Signed)
Problem: Phase I Progression Outcomes Goal: Hemodynamically stable Outcome: Progressing     

## 2014-08-06 NOTE — Progress Notes (Signed)
      Patient alert and comfortable.  Palpable DP pulses bilaterally.  Groins soft without hematoma.    Apryle Stowell MAUREEN PA-C

## 2014-08-06 NOTE — Transfer of Care (Signed)
Immediate Anesthesia Transfer of Care Note  Patient: Curtis Ballard  Procedure(s) Performed: Procedure(s): Abdominal Aortic Endovascular Stent Graft; Embolization and Coiling of Right and Left Internal Iliac Artery (N/A)  Patient Location: PACU  Anesthesia Type:General  Level of Consciousness: awake, alert , oriented and patient cooperative  Airway & Oxygen Therapy: Patient Spontanous Breathing and Patient connected to face mask oxygen  Post-op Assessment: Report given to PACU RN and Post -op Vital signs reviewed and stable  Post vital signs: Reviewed and stable  Complications: No apparent anesthesia complications

## 2014-08-06 NOTE — Progress Notes (Signed)
VASCULAR LAB PRELIMINARY  ARTERIAL  ABI completed:    RIGHT    LEFT    PRESSURE WAVEFORM  PRESSURE WAVEFORM  BRACHIAL 116 Triphasic  BRACHIAL 118 Triphasic   DP 106 Monophasic   DP 100 Monophasic   AT   AT    PT 119 Triphasic  PT 135 Triphasic   PER   PER    GREAT TOE  NA GREAT TOE  NA    RIGHT LEFT  ABI 1.01 1.14     Fabiha Rougeau, RVT 08/06/2014, 12:41 PM

## 2014-08-06 NOTE — Op Note (Signed)
OPERATIVE REPORT  Date of Surgery: 08/05/2014 - 08/06/2014  Surgeon: Josephina GipJames Lawson, MD  Assistant: Dr. Tawanna Coolerodd early  Pre-op Diagnosis: Ruptured AAA with bilateral large common iliac artery aneurysms and thrombosis right common iliac artery aneurysm  Post-op Diagnosis: Same  Procedure: Procedure(s): #1 bilateral femoral artery exposure via cutdown #2 insertion aorto external iliac stent graft using gore C3 endograft     A-main body via left common femoral approach with 35 x 14 x 14 cm graft    B-left iliac limb using 12 mm x 14 cm graft    C-right iliac limb using 12 mm x 14 cm graft     D-right iliac extender using 12 mm x 10 cm graft     E-additional right iliac extender using 12 mm x 7 cm graft #3 coil embolization of bilateral internal iliac arteries using Nester coils 4 on the right and 5 on the left #4 completion angiography  Anesthesia: General  EBL: 200 cc  Complications: None  Procedure Details: The patient was taken to the operating room placed in supine position urgently from the emergency department. He was known to have a ruptured abdominal aortic aneurysm with bilateral large common iliac artery aneurysms. He had no pulse in the right femoral artery and excellent pulse in the left femoral artery with suspicion that he had thrombosed the right common iliac artery aneurysm. He had appropriate monitoring lines inserted by anesthesia preoperatively. After induction of satisfactory general endotracheal anesthesia the abdomen and groins were both prepped and draped in routine sterile manner. Oblique incisions were made bilaterally Dr. early working on the right side Dr. Hart RochesterLawson working on the left side. The common superficial and profunda femoris arteries were dissected free. There were normal to palpation. The right femoral artery had no palpable pulse left and 3+ pulse. A short 8 French sheath was inserted in the left common femoral artery guidewire passed into the suprarenal  aorta underfluoroscopic guidance. A pigtail catheter was positioned in the terminal aorta and aortogram injecting 20 cc of contrast 20 cc/s was obtained. This revealed total thrombosis of the right common iliac artery aneurysm as suspected. There was a large left common iliac artery aneurysm which had laminated thrombus but was not thrombosed. The right common femoral artery was entered with a puncture needle guidewire passed into the right common iliac artery aneurysm without difficulty. It did traverse this area up into the abdominal aorta without difficulty. Following this attention was turned to embolizing bilateral internal iliac arteries since the large aneurysms extending down to their origin bilaterally. On the right side the internal iliac artery was cannulated in 28 into 12 Nester coils were inserted with satisfactory thrombosis of the right internal iliac artery. Attention was turned to the left side and cannulating the left internal iliac artery was quite difficult. Multiple attempts were made from the left side using variety of catheters. Guidewire would extend into the internal iliac artery but we could not get that to remain in place to get the catheter to place the coils. Therefore we used a crossover catheter from the contralateral right side and were able to cannulate the internal iliac artery with the crossover sheath. 1   8 Nester coils l and 412 Nester coils were utilized on the left side. This has been completed the 8 French sheath in the left femoral artery was exchanged for a 20 French sheath after initially doing a guidewire exchange inserting a Amplatz superstiff wire. Sheath was positioned in the infrarenal aorta and  a 35 x 14 x 14 main body graft was then inserted through the sheath via the left side and positioned just distal to the renal arteries. Angiogram was performed injecting 10 cc of contrast at 10 cc/s prior to positioning this to localize the origin of the renal arteries. Graft  was deployed without difficulty down to the contralateral gate. On the right side the contralateral gate was cannulated catheter exchange was performed and an Amplatz wire was passed up through the cannulae gait. A 12 x 14 cm limb was deployed appropriately. Retrogrades arteriogram was performed through the sheath which revealed the necessity to insert a second extender and a 12 mm x 10 cm is utilized. There was some kinking at the distal and because of curvature of the artery and therefore a third limb was placed on the right side which was a 12 mm x 7 cm limb. Attention was returned to the left side where a 12 mm x 14 cm limb was deployed appropriately and easily extended into the external iliac artery. All areas were then dilated with a Q  50 balloon. Completion angiography was performed which revealed no evidence of endoleak and good position of the graft. Following this the guidewires and sheaths were all removed and the femoral arteries were repaired bilaterally with 6-0 Prolene sutures. There was excellent pulses distal to there appear bilaterally with good Doppler flow. Protamine was then given to reverse the heparin. A total of 14,000 units of heparin was utilized during the procedure and 50 mg of protamine was utilized to reverse this. Hemostasis was achieved and the groin wounds were closed in layers with Vicryl in subcuticular fashion with Dermabond patient taken to the recovery room in stable condition. Urinary output throughout the case was stable hemodynamically and had an estimated 200 cc of blood loss  Josephina GipJames Lawson, MD 08/06/2014 6:51 AM

## 2014-08-06 NOTE — Plan of Care (Signed)
Problem: Phase I Progression Outcomes Goal: Vascular site scale level 0 - I Vascular Site Scale Level 0: No bruising/bleeding/hematoma Level I (Mild): Bruising/Ecchymosis, minimal bleeding/ooozing, palpable hematoma < 3 cm Level II (Moderate): Bleeding not affecting hemodynamic parameters, pseudoaneurysm, palpable hematoma > 3 cm Level III (Severe) Bleeding which affects hemodynamic parameters or retroperitoneal hemorrhage  Outcome: Progressing     

## 2014-08-06 NOTE — Progress Notes (Signed)
    OPERATIVE REPORT  DATE OF SURGERY: 08/06/2014  PATIENT: Curtis FearingJames Knauff, 63 y.o. male MRN: [id number]  DOB: 10/31/1950  PRE-OPERATIVE DIAGNOSIS: Ruptured aortoiliac aneurysm  POST-OPERATIVE DIAGNOSIS:  Same  PROCEDURE: Exposure of right common femoral artery for endograft repair of aorta iliac aneurysm which be dictated separately with Dr. Hart RochesterLawson  SURGEON:  Gretta Beganodd Taj Nevins, M.D.  PHYSICIAN ASSISTANT: Rhyne  ANESTHESIA:  Gen.  EBL: Minimal ml  Total I/O In: 30 [I.V.:30] Out: 310 [Urine:310]  BLOOD ADMINISTERED: None  DRAINS: None  SPECIMEN: None  COUNTS CORRECT:  YES  PLAN OF CARE: PACU   PATIENT DISPOSITION:  PACU - hemodynamically stable  PROCEDURE DETAILS: The patient to emergency room with ruptured abdominal aortic aneurysm. Large aortic and bilateral iliac artery aneurysms. He was taken emergently to the operating room for stent graft repair which would dictate a separate note. He did have a right groin cutdown by myself. This was an oblique incision just above the inguinal crease. The common femoral artery was exposed at the inguinal ligament and encircled with a vessel loop. Exposure was continued down to the bifurcation of the common femoral artery and the superficial femoral and profunda femoris arteries were encircled with Vesseloops. At the end of the procedure was admitted for bleeding and backbleeding and the artery was closed with a running 5-0 Prolene suture. The wounds were closed with 2-0 Vicryl in several layers and the subcutaneous fat and the skin was closed with 3-0 subcuticular Vicryl stitch   Gretta Beganodd Taila Basinski, M.D. 08/06/2014 9:51 AM

## 2014-08-06 NOTE — Progress Notes (Signed)
Utilization Review Completed.Lovelee Forner T11/19/2015  

## 2014-08-06 NOTE — Consult Note (Signed)
  Vascular Surgery H&P  Chief Complaint: Ruptured abdominal aortic aneurysm  HPI: Curtis Ballard is a 63 y.o. male who presents for evaluation of ruptured abdominal aortic aneurysm. Patient this morning developed suprapubic pain which has been present all day. He denies any nausea or vomiting or previous history of renal stones. He came to emergency department where CT scan was obtained and this revealed large abdominal aortic aneurysm with retroperitoneal hematoma and large bilateral common iliac aneurysms. Patient was hemodynamically stable in emergency department and taken immediately to the operating room for attempted repair   History reviewed. No pertinent past medical history. History reviewed. No pertinent past surgical history. History   Social History  . Marital Status: Unknown    Spouse Name: N/A    Number of Children: N/A  . Years of Education: N/A   Social History Main Topics  . Smoking status: Current Every Day Smoker -- 0.50 packs/day for 20 years    Types: Cigarettes  . Smokeless tobacco: None  . Alcohol Use: No  . Drug Use: Yes    Special: Marijuana  . Sexual Activity: None   Other Topics Concern  . None   Social History Narrative  . None   No family history on file. No Known Allergies Prior to Admission medications   Medication Sig Start Date End Date Taking? Authorizing Provider  naproxen sodium (ANAPROX) 220 MG tablet Take 220 mg by mouth daily as needed (pain).   Yes Historical Provider, MD  OVER THE COUNTER MEDICATION Take 2 capsules by mouth daily as needed (congestion). "Cold Remedy"   Yes Historical Provider, MD     Positive ROS: Denies chest pain, dyspnea on exertion, PND, orthopnea. No claudication by history. Denies lateralizing weakness, aphasia, amaurosis fugax, syncope.  All other systems have been reviewed and were otherwise negative with the exception of those mentioned in the HPI and as above.  Physical Exam: Filed Vitals:   08/06/14  0015  BP: 138/101  Pulse: 95  Temp:   Resp: 13    General: Alert, no acute distress HEENT: Normal for age-obese Cardiovascular: Regular rate and rhythm. Carotid pulses 2+, no bruits audible Respiratory: Clear to auscultation. No cyanosis, no use of accessory musculature GI: No organomegaly, abdomen is soft and obese-tender and lower abdomen Skin: No lesions in the area of chief complaint Neurologic: Sensation intact distally Psychiatric: Patient is competent for consent with normal mood and affect Musculoskeletal: No obvious deformities Extremities: Left leg with 3+ femoral pulses palpable no popliteal or distal pulses palpable. Right leg with absent femoral and distal pulses.  Labs reviewed: Creatinine 1.8 hematocrit 45%  Imaging reviewed: CT scan without contrast was obtained which reveals 7 cm infrarenal abdominal aortic aneurysm with bilateral 5 cm common iliac artery aneurysms. Retroperitoneal hematoma is present around aneurysm.   Assessment/Plan:  Ruptured abdominal aortic and/or bilateral common iliac aneurysms with thrombosis right iliac artery Will plan attempted aortic stent graft via left common femoral approach with aorto-uni-iliac gore  graft and left-to-right femoral-femoral bypass Risks discussed with patient's wife and his son and fact that this is life-threatening emergency. We'll proceed to OR immediately   Josephina GipJames Lawson, MD 08/06/2014 12:59 AM

## 2014-08-06 NOTE — Progress Notes (Signed)
Spoke with peggy volunteer no family in waiting room at this time when they sign in she will notify them pt moved to 2s03

## 2014-08-06 NOTE — Plan of Care (Signed)
Problem: Consults Goal: Skin Care Protocol Initiated - if Braden Score 18 or less If consults are not indicated, leave blank or document N/A Outcome: Progressing     

## 2014-08-06 NOTE — Anesthesia Preprocedure Evaluation (Addendum)
Anesthesia Evaluation  Patient identified by MRN, date of birth, ID band Patient awake    Reviewed: Allergy & Precautions, H&P , NPO status , Patient's Chart, lab work & pertinent test results  Airway Mallampati: II   Neck ROM: full    Dental  (+) Edentulous Upper, Poor Dentition, Dental Advisory Given Lower teeth broken and decayed at gumline.  Multiple missing.:   Pulmonary Current Smoker,          Cardiovascular + Peripheral Vascular Disease     Neuro/Psych    GI/Hepatic   Endo/Other  obese  Renal/GU ARFRenal disease     Musculoskeletal   Abdominal   Peds  Hematology   Anesthesia Other Findings   Reproductive/Obstetrics                           Anesthesia Physical Anesthesia Plan  ASA: III and emergent  Anesthesia Plan: General   Post-op Pain Management:    Induction: Intravenous  Airway Management Planned: Oral ETT  Additional Equipment: Arterial line, CVP and Ultrasound Guidance Line Placement  Intra-op Plan:   Post-operative Plan: Possible Post-op intubation/ventilation and Extubation in OR  Informed Consent: I have reviewed the patients History and Physical, chart, labs and discussed the procedure including the risks, benefits and alternatives for the proposed anesthesia with the patient or authorized representative who has indicated his/her understanding and acceptance.     Plan Discussed with: CRNA, Anesthesiologist and Surgeon  Anesthesia Plan Comments:         Anesthesia Quick Evaluation

## 2014-08-06 NOTE — Plan of Care (Signed)
Problem: Phase I Progression Outcomes Goal: Pain controlled with appropriate interventions Outcome: Progressing     

## 2014-08-06 NOTE — Anesthesia Procedure Notes (Signed)
Procedure Name: Intubation Date/Time: 08/06/2014 1:33 AM Performed by: Julianne RiceBILOTTA, Amaury Kuzel Z Pre-anesthesia Checklist: Patient identified, Emergency Drugs available, Suction available, Patient being monitored and Timeout performed Patient Re-evaluated:Patient Re-evaluated prior to inductionOxygen Delivery Method: Circle system utilized Preoxygenation: Pre-oxygenation with 100% oxygen Intubation Type: IV induction, Rapid sequence and Cricoid Pressure applied Laryngoscope Size: Mac and 4 Grade View: Grade I Tube type: Oral Number of attempts: 1 Airway Equipment and Method: Stylet Placement Confirmation: ETT inserted through vocal cords under direct vision,  positive ETCO2 and breath sounds checked- equal and bilateral Secured at: 23 cm Tube secured with: Tape Dental Injury: Teeth and Oropharynx as per pre-operative assessment

## 2014-08-06 NOTE — Plan of Care (Signed)
Problem: Phase I Progression Outcomes Goal: Distal pulses equal to baseline Outcome: Completed/Met Date Met:  08/06/14

## 2014-08-07 ENCOUNTER — Encounter (HOSPITAL_COMMUNITY): Payer: Self-pay | Admitting: Vascular Surgery

## 2014-08-07 LAB — BASIC METABOLIC PANEL
Anion gap: 10 (ref 5–15)
BUN: 12 mg/dL (ref 6–23)
CO2: 25 mEq/L (ref 19–32)
Calcium: 8.1 mg/dL — ABNORMAL LOW (ref 8.4–10.5)
Chloride: 107 mEq/L (ref 96–112)
Creatinine, Ser: 1.21 mg/dL (ref 0.50–1.35)
GFR calc Af Amer: 72 mL/min — ABNORMAL LOW (ref >90)
GFR, EST NON AFRICAN AMERICAN: 62 mL/min — AB (ref >90)
GLUCOSE: 123 mg/dL — AB (ref 70–99)
Potassium: 4.3 mEq/L (ref 3.7–5.3)
Sodium: 142 mEq/L (ref 137–147)

## 2014-08-07 LAB — POCT I-STAT 7, (LYTES, BLD GAS, ICA,H+H)
Acid-base deficit: 2 mmol/L (ref 0.0–2.0)
Bicarbonate: 22.9 mEq/L (ref 20.0–24.0)
Calcium, Ion: 1.2 mmol/L (ref 1.13–1.30)
HCT: 38 % — ABNORMAL LOW (ref 39.0–52.0)
Hemoglobin: 12.9 g/dL — ABNORMAL LOW (ref 13.0–17.0)
O2 SAT: 99 %
POTASSIUM: 5 meq/L (ref 3.7–5.3)
Sodium: 139 mEq/L (ref 137–147)
TCO2: 24 mmol/L (ref 0–100)
pCO2 arterial: 37.5 mmHg (ref 35.0–45.0)
pH, Arterial: 7.387 (ref 7.350–7.450)
pO2, Arterial: 118 mmHg — ABNORMAL HIGH (ref 80.0–100.0)

## 2014-08-07 LAB — CBC
HEMATOCRIT: 32.1 % — AB (ref 39.0–52.0)
Hemoglobin: 10.4 g/dL — ABNORMAL LOW (ref 13.0–17.0)
MCH: 30.3 pg (ref 26.0–34.0)
MCHC: 32.4 g/dL (ref 30.0–36.0)
MCV: 93.6 fL (ref 78.0–100.0)
Platelets: 137 10*3/uL — ABNORMAL LOW (ref 150–400)
RBC: 3.43 MIL/uL — ABNORMAL LOW (ref 4.22–5.81)
RDW: 14.9 % (ref 11.5–15.5)
WBC: 8.4 10*3/uL (ref 4.0–10.5)

## 2014-08-07 MED ORDER — INFLUENZA VAC SPLIT QUAD 0.5 ML IM SUSY
0.5000 mL | PREFILLED_SYRINGE | INTRAMUSCULAR | Status: DC
  Filled 2014-08-07: qty 0.5

## 2014-08-07 NOTE — Plan of Care (Signed)
Problem: Phase I Progression Outcomes Goal: Vascular site scale level 0 - I Vascular Site Scale Level 0: No bruising/bleeding/hematoma Level I (Mild): Bruising/Ecchymosis, minimal bleeding/ooozing, palpable hematoma < 3 cm Level II (Moderate): Bleeding not affecting hemodynamic parameters, pseudoaneurysm, palpable hematoma > 3 cm Level III (Severe) Bleeding which affects hemodynamic parameters or retroperitoneal hemorrhage  Outcome: Completed/Met Date Met:  08/07/14 Goal: Pain controlled with appropriate interventions Outcome: Completed/Met Date Met:  08/07/14 Goal: Initial discharge plan identified Outcome: Completed/Met Date Met:  08/07/14

## 2014-08-07 NOTE — Progress Notes (Signed)
Patient ID: Curtis Ballard, male   DOB: 06/09/1951, 63 y.o.   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 Patient states still has some numbness in right foot but is improving. No nausea or vomiting. States pain in abdomen is much less. Has not had any diet yet.  Objective:  Filed Vitals:   08/07/14 0700  BP: 147/93  Pulse: 73  Temp:   Resp: 17    Gen. alert and oriented 3 in no apparent distress Abdomen soft point tenderness Inguinal wounds healing satisfactorily with 3+ femoral pulses palpable in both feet well perfused   Labs:  Recent Labs Lab 08/05/14 2004 08/06/14 0650 08/07/14 0455  CREATININE 1.80* 1.36* 1.21    Recent Labs Lab 08/05/14 1951 08/05/14 2004 08/06/14 0650 08/07/14 0455  NA 140 141 140 142  K 6.2* 5.9* 5.5* 4.3  CL 104 108 106 107  CO2 22  --  20 25  BUN 16 17 16 12   CREATININE 1.67* 1.80* 1.36* 1.21  GLUCOSE 142* 148* 155* 123*  CALCIUM 9.2  --  8.2* 8.1*    Recent Labs Lab 08/05/14 1951 08/05/14 2004 08/06/14 0650 08/07/14 0455  WBC 7.7  --  10.0 8.4  HGB 15.0 17.0 12.4* 10.4*  HCT 44.5 50.0 36.7* 32.1*  PLT 223  --  161 137*    Recent Labs Lab 08/06/14 0650  INR 1.20    I/O last 3 completed shifts: In: 6080 [I.V.:4980; IV Piggyback:1100] Out: 2516 [Urine:2365; Stool:1; Blood:150]  Imaging: Dg Chest Port 1 View  08/06/2014   CLINICAL DATA:  Postop stent placement.  Evaluate line placement  EXAM: PORTABLE CHEST - 1 VIEW  COMPARISON:  None  FINDINGS: There is a right IJ catheter with tip in the projection of the SVC. The heart size and mediastinal contours are within normal limits. The lung volumes are low and there is mild bibasilar atelectasis.  IMPRESSION: 1. Low lung volumes and bibasilar atelectasis. 2. Right IJ catheter in place without pneumothorax.   Electronically Signed    By: Signa Kellaylor  Stroud M.D.   On: 08/06/2014 08:45   Dg Abd Portable 1v  08/06/2014   CLINICAL DATA:  Postop stent placement and line placement.  EXAM: PORTABLE ABDOMEN - 1 VIEW  COMPARISON:  CT 08/05/2014  FINDINGS: Examination demonstrates a nonobstructive bowel gas pattern. There is evidence of patient's recently placed aortic and bi-iliac stent graft. Linear coiled radiopaque material is present over the upper pelvis bilaterally likely related to patient's recent vascular surgery. Contrast is present within the intrarenal collecting systems and bladder. A catheter is present over the midline pelvis. There are minimal degenerative changes of the spine and hips.  IMPRESSION: Nonobstructive bowel gas pattern.  Aortic and bi-iliac stent graft.   Electronically Signed   By: Elberta Fortisaniel  Boyle M.D.   On: 08/06/2014 08:58   Ct Renal Stone Study  08/06/2014   ADDENDUM REPORT: 08/06/2014 00:09  ADDENDUM: It should be noted the infrarenal neck measures approximately 2.4 x 2.5 cm. The initial aneurysmal dilatation arises approximately 13 mm below the renal arteries.   Electronically Signed   By: Alcide CleverMark  Lukens M.D.   On: 08/06/2014 00:09   08/06/2014   CLINICAL DATA:  Periumbilical abdominal pain without hematuria  EXAM: CT ABDOMEN AND PELVIS WITHOUT CONTRAST  TECHNIQUE: Multidetector CT imaging of the abdomen and pelvis was performed following the standard protocol without  IV contrast.  COMPARISON:  None.  FINDINGS: Lung bases show mild dependent atelectasis. No focal infiltrate is seen.  The liver, gallbladder, spleen, adrenal glands and pancreas are within normal limits with the exception of a few small hypodensities within the liver consistent with cysts.  Small amount of free fluid is noted surrounding the dome of the liver. Kidneys demonstrate no renal calculi or obstructive changes.  There is tortuosity of the abdominal aorta and evidence of a large infrarenal abdominal aortic aneurysm which measures 6.7 by 6.3 cm in  greatest AP and transverse dimensions respectively. This extends to the level of the AA iliac bifurcation and there is aneurysmal dilatation of both common iliac arteries. On the left the common iliac artery measures 6.6 cm in greatest dimension and on the right 6.5 cm in greatest dimension. Evaluation of lumen is difficult with the lack of IV contrast. A significant amount of both retroperitoneal and intraperitoneal hemorrhage is noted consistent with acute aortic rupture. This likely occurred in the right lateral wall of the aorta although evaluation is difficult. The external iliac arteries bilaterally show calcific changes but are not aneurysmally dilated.  The bladder is partially distended. No pelvic mass lesion is seen. Mild diverticular change is noted. The osseous structures show no acute abnormality.  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 There are changes consistent with rupture and retroperitoneal hemorrhage a small amount of free peritoneal fluid is noted likely related to the hemorrhage as well.  Critical Value/emergent results were called by telephone at the time of interpretation on 08/05/2014 at 11:48 pm to Dr. Abagail KitchensMEGAN TAYLOR , who verbally acknowledged these results.  Electronically Signed: By: Alcide CleverMark  Lukens M.D. 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 Plan advance diet DC A-line Transfer to 2 W. Increase ambulation Hopefully DC home in a.m.   Josephina GipJames Carrianne Hyun, MD 08/07/2014 7:47 AM

## 2014-08-07 NOTE — Anesthesia Postprocedure Evaluation (Signed)
Anesthesia Post Note  Patient: Curtis Ballard  Procedure(s) Performed: Procedure(s) (LRB): Abdominal Aortic Endovascular Stent Graft; Embolization and Coiling of Right and Left Internal Iliac Artery (N/A)  Anesthesia type: General  Patient location: PACU  Post pain: Pain level controlled and Adequate analgesia  Post assessment: Post-op Vital signs reviewed, Patient's Cardiovascular Status Stable, Respiratory Function Stable, Patent Airway and Pain level controlled  Last Vitals:  Filed Vitals:   08/07/14 0700  BP: 147/93  Pulse: 73  Temp:   Resp: 17    Post vital signs: Reviewed and stable  Level of consciousness: awake, alert  and oriented  Complications: No apparent anesthesia complications

## 2014-08-08 NOTE — Plan of Care (Signed)
Problem: Phase II Progression Outcomes Goal: Tolerates diet Outcome: Completed/Met Date Met:  08/08/14

## 2014-08-08 NOTE — Plan of Care (Signed)
Problem: Phase II Progression Outcomes Goal: Pain controlled with appropriate interventions Outcome: Completed/Met Date Met:  08/08/14

## 2014-08-08 NOTE — Plan of Care (Signed)
Problem: Phase II Progression Outcomes Goal: Vascular site scale level 0 - I Vascular Site Scale Level 0: No bruising/bleeding/hematoma Level I (Mild): Bruising/Ecchymosis, minimal bleeding/ooozing, palpable hematoma < 3 cm Level II (Moderate): Bleeding not affecting hemodynamic parameters, pseudoaneurysm, palpable hematoma > 3 cm Level III (Severe) Bleeding which affects hemodynamic parameters or retroperitoneal hemorrhage  Outcome: Completed/Met Date Met:  08/08/14

## 2014-08-08 NOTE — Plan of Care (Signed)
Problem: Phase I Progression Outcomes Goal: Voiding-avoid urinary catheter unless indicated Outcome: Completed/Met Date Met:  08/08/14     

## 2014-08-08 NOTE — Progress Notes (Signed)
   VASCULAR SURGERY ASSESSMENT & PLAN:  * 2 Days Post-Op s/p: emergent endovascular repair of ruptured abdominal aortic aneurysm.  *  Will start diet slowly.  * Ambulate.  * Lovenox for DVT prophylaxis.  * anticipate discharge early next week.  SUBJECTIVE: No nausea. Passing flatus. No BM.  PHYSICAL EXAM: Filed Vitals:   08/07/14 1600 08/07/14 1626 08/07/14 2151 08/08/14 0634  BP:  138/90 230/74 154/73  Pulse:  123 92 95  Temp: 98.7 F (37.1 C) 98.5 F (36.9 C) 99.3 F (37.4 C) 98.8 F (37.1 C)  TempSrc: Oral Oral Oral Oral  Resp:  20 20 21   Height:      Weight:      SpO2:  98% 97% 96%   Abdomen is soft and nontender with normal pitch bowel sounds. Groin incisions look fine. Feet are warm and well perfused.  LABS: Lab Results  Component Value Date   WBC 8.4 08/07/2014   HGB 10.4* 08/07/2014   HCT 32.1* 08/07/2014   MCV 93.6 08/07/2014   PLT 137* 08/07/2014   Lab Results  Component Value Date   CREATININE 1.21 08/07/2014   Lab Results  Component Value Date   INR 1.20 08/06/2014   CBG (last 3)  No results for input(s): GLUCAP in the last 72 hours.  Active Problems:   AAA (abdominal aortic aneurysm, ruptured)   Cari CarawayChris Dickson Beeper: [phone] 08/08/2014

## 2014-08-09 LAB — BASIC METABOLIC PANEL
Anion gap: 17 — ABNORMAL HIGH (ref 5–15)
BUN: 14 mg/dL (ref 6–23)
CHLORIDE: 99 meq/L (ref 96–112)
CO2: 21 mEq/L (ref 19–32)
Calcium: 8.6 mg/dL (ref 8.4–10.5)
Creatinine, Ser: 1.09 mg/dL (ref 0.50–1.35)
GFR calc Af Amer: 82 mL/min — ABNORMAL LOW (ref >90)
GFR calc non Af Amer: 70 mL/min — ABNORMAL LOW (ref >90)
Glucose, Bld: 89 mg/dL (ref 70–99)
POTASSIUM: 4.1 meq/L (ref 3.7–5.3)
Sodium: 137 mEq/L (ref 137–147)

## 2014-08-09 LAB — CBC
HEMATOCRIT: 30.9 % — AB (ref 39.0–52.0)
Hemoglobin: 10 g/dL — ABNORMAL LOW (ref 13.0–17.0)
MCH: 30.2 pg (ref 26.0–34.0)
MCHC: 32.4 g/dL (ref 30.0–36.0)
MCV: 93.4 fL (ref 78.0–100.0)
PLATELETS: 160 10*3/uL (ref 150–400)
RBC: 3.31 MIL/uL — AB (ref 4.22–5.81)
RDW: 14.1 % (ref 11.5–15.5)
WBC: 7.4 10*3/uL (ref 4.0–10.5)

## 2014-08-09 MED ORDER — BISACODYL 10 MG RE SUPP
10.0000 mg | Freq: Every day | RECTAL | Status: DC | PRN
  Administered 2014-08-09: 10 mg via RECTAL
  Filled 2014-08-09: qty 1

## 2014-08-09 NOTE — Progress Notes (Addendum)
  [facility] Progress Note  08/09/2014 7:52 AM 3 Days Post-Op  Subjective:  Doing well today. Tolerated regular diet last night. Denies nausea and abdominal pain. Numbness improving in right upper thigh. Still with numbness of right lower leg. Passing flatus. No BM.    Filed Vitals:   08/09/14 0425  BP: 153/87  Pulse: 78  Temp: 99.4 F (37.4 C)  Resp: 18    Physical Exam: Incisions:  Bilateral groin incisions healing well Extremities:  Warm and well-perfused.  Abdomen: soft, normoactive bowel sounds. Mild tenderness to right lower quadrant.   CBC    Component Value Date/Time   WBC 7.4 08/09/2014 0347   RBC 3.31* 08/09/2014 0347   HGB 10.0* 08/09/2014 0347   HCT 30.9* 08/09/2014 0347   PLT 160 08/09/2014 0347   MCV 93.4 08/09/2014 0347   MCH 30.2 08/09/2014 0347   MCHC 32.4 08/09/2014 0347   RDW 14.1 08/09/2014 0347   LYMPHSABS 2.5 08/05/2014 1951   MONOABS 0.5 08/05/2014 1951   EOSABS 0.2 08/05/2014 1951   BASOSABS 0.0 08/05/2014 1951    BMET    Component Value Date/Time   NA 137 08/09/2014 0347   K 4.1 08/09/2014 0347   CL 99 08/09/2014 0347   CO2 21 08/09/2014 0347   GLUCOSE 89 08/09/2014 0347   BUN 14 08/09/2014 0347   CREATININE 1.09 08/09/2014 0347   CALCIUM 8.6 08/09/2014 0347   GFRNONAA 70* 08/09/2014 0347   GFRAA 82* 08/09/2014 0347    INR    Component Value Date/Time   INR 1.20 08/06/2014 0650     Intake/Output Summary (Last 24 hours) at 08/09/14 0752 Last data filed at 08/09/14 [id number]  Gross per 24 hour  Intake   1860 ml  Output   2025 ml  Net   -165 ml     Assessment:  63 y.o. male is s/p: emergent endovascular repair of ruptured abdominal aortic aneurysm   3 Days Post-Op  Plan: -Doing well with regular diet. Passing flatus.  -No BM yet. Bowel regimen. -Groin incisions healing well. Continue dry gauze. Right leg numbness improving. -Ambulate.  -DVT prophylaxis:  Lovenox -Dispo: Anticipate discharge next 1-2  days.    Maris BergerKimberly Trinh, PA-C [facility] Office: [phone] Pager: [phone] 08/09/2014 7:52 AM  Agree with above. Possible discharge tomorrow.  Waverly Ferrarihristopher Piccola Arico, MD, FACS Beeper [phone] 08/09/2014

## 2014-08-10 ENCOUNTER — Other Ambulatory Visit: Payer: Self-pay | Admitting: *Deleted

## 2014-08-10 DIAGNOSIS — Z95828 Presence of other vascular implants and grafts: Secondary | ICD-10-CM

## 2014-08-10 LAB — CBC
HEMATOCRIT: 33.4 % — AB (ref 39.0–52.0)
HEMOGLOBIN: 10.8 g/dL — AB (ref 13.0–17.0)
MCH: 30.1 pg (ref 26.0–34.0)
MCHC: 32.3 g/dL (ref 30.0–36.0)
MCV: 93 fL (ref 78.0–100.0)
Platelets: 227 10*3/uL (ref 150–400)
RBC: 3.59 MIL/uL — AB (ref 4.22–5.81)
RDW: 13.7 % (ref 11.5–15.5)
WBC: 7.9 10*3/uL (ref 4.0–10.5)

## 2014-08-10 LAB — TYPE AND SCREEN
ABO/RH(D): O POS
ANTIBODY SCREEN: NEGATIVE
UNIT DIVISION: 0
Unit division: 0
Unit division: 0
Unit division: 0

## 2014-08-10 LAB — BASIC METABOLIC PANEL
Anion gap: 13 (ref 5–15)
BUN: 15 mg/dL (ref 6–23)
CHLORIDE: 100 meq/L (ref 96–112)
CO2: 24 meq/L (ref 19–32)
CREATININE: 1.19 mg/dL (ref 0.50–1.35)
Calcium: 9.4 mg/dL (ref 8.4–10.5)
GFR calc non Af Amer: 63 mL/min — ABNORMAL LOW (ref >90)
GFR, EST AFRICAN AMERICAN: 73 mL/min — AB (ref >90)
Glucose, Bld: 100 mg/dL — ABNORMAL HIGH (ref 70–99)
Potassium: 4.2 mEq/L (ref 3.7–5.3)
SODIUM: 137 meq/L (ref 137–147)

## 2014-08-10 MED ORDER — OXYCODONE HCL 5 MG PO TABS: 5.0000 mg | ORAL_TABLET | Freq: Four times a day (QID) | ORAL | Status: DC | PRN

## 2014-08-10 MED ORDER — AMLODIPINE BESYLATE 5 MG PO TABS: 5.0000 mg | ORAL_TABLET | Freq: Every day | ORAL | Status: DC

## 2014-08-10 MED ORDER — SIMVASTATIN 10 MG PO TABS: 10.0000 mg | ORAL_TABLET | Freq: Every day | ORAL | Status: DC

## 2014-08-10 MED ORDER — ASPIRIN EC 81 MG PO TBEC: 81.0000 mg | DELAYED_RELEASE_TABLET | Freq: Every day | ORAL | Status: DC

## 2014-08-10 NOTE — Discharge Summary (Signed)
[facility] Discharge Summary  Curtis Ballard Mar 31, 1951 63 y.o. male  [id number]  Admission Date: 08/05/2014  Discharge Date: 08/10/2014  Physician: Pryor Ochoa, MD  Admission Diagnosis: AAA (abdominal aortic aneurysm, ruptured) [I71.3] Abdominal pain [R10.9]   HPI:   This is a 63 y.o. male who presented to the [facility] ED for evaluation of a ruptured abdominal aortic aneurysm on 08/06/14. Earlier that morning, he developed suprapubic pain which has been present all day. He denies any nausea or vomiting or previous history of renal stones. He came to emergency department where CT scan was obtained and this revealed large abdominal aortic aneurysm with retroperitoneal hematoma and large bilateral common iliac aneurysms. Patient was hemodynamically stable in emergency department and taken immediately to the operating room for attempted repair  Hospital Course:  The patient was admitted to the hospital and taken to the operating room on 08/06/2014 and underwent: emergent endovascular repair of ruptured abdominal aortic aneurysm with bilateral groin cutdown.   The patient tolerated the procedure well and was transported to the PACU in stable condition.   On POD 1, he was progressively nicely. He had palpable femoral pulses bilaterally and well-perfused feet. He had some numbness in his right foot that was improving. He denied any nausea or vomiting and his abdominal pain had improved. His diet was advanced and he was transferred to the floor.   On POD 2, his diet was advanced slowly. He denied any nausea and was starting to pass flatus.  On POD 3, he was tolerating a regular diet well and continued to deny any nausea or abdominal pain. His right leg numbness was improving.   On POD 4, his abdomen was soft without no significant tenderness. He had three bowel movements. He exhibited no signs or symptoms of ischemic colitis. He was tolerating his diet well and  was ambulating well. His feet were well perfused with ABIs 1.0 bilaterally and triphasic posterior tibial flow. His incisions were healing well. He was discharged home on POD 4 in good condition. Symptoms of ischemic colitis were discussed with the patient and he was advised to notify the office immediately if he experienced any of these symptoms. Case management was consulted regarding establishing a PCP for follow up of his chronic medical problems.   CBC    Component Value Date/Time   WBC 7.9 08/10/2014 0450   RBC 3.59* 08/10/2014 0450   HGB 10.8* 08/10/2014 0450   HCT 33.4* 08/10/2014 0450   PLT 227 08/10/2014 0450   MCV 93.0 08/10/2014 0450   MCH 30.1 08/10/2014 0450   MCHC 32.3 08/10/2014 0450   RDW 13.7 08/10/2014 0450   LYMPHSABS 2.5 08/05/2014 1951   MONOABS 0.5 08/05/2014 1951   EOSABS 0.2 08/05/2014 1951   BASOSABS 0.0 08/05/2014 1951    BMET    Component Value Date/Time   NA 137 08/10/2014 0450   K 4.2 08/10/2014 0450   CL 100 08/10/2014 0450   CO2 24 08/10/2014 0450   GLUCOSE 100* 08/10/2014 0450   BUN 15 08/10/2014 0450   CREATININE 1.19 08/10/2014 0450   CALCIUM 9.4 08/10/2014 0450   GFRNONAA 63* 08/10/2014 0450   GFRAA 73* 08/10/2014 0450     Discharge Instructions:   The patient is discharged to home with extensive instructions on wound care and progressive ambulation.  They are instructed not to drive or perform any heavy lifting until returning to see the physician in his office.  Discharge Instructions  ABDOMINAL PROCEDURE/ANEURYSM REPAIR/AORTO-BIFEMORAL BYPASS:  Call MD for increased abdominal pain; cramping diarrhea; nausea/vomiting    Complete by:  As directed      Call MD for:  redness, tenderness, or signs of infection (pain, swelling, bleeding, redness, odor or green/yellow discharge around incision site)    Complete by:  As directed      Call MD for:  severe or increased pain, loss or decreased feeling  in affected limb(s)    Complete by:  As  directed      Call MD for:  temperature >100.5    Complete by:  As directed      Driving Restrictions    Complete by:  As directed   No driving for 2 weeks     Increase activity slowly    Complete by:  As directed   Walk with assistance use walker or cane as needed     Lifting restrictions    Complete by:  As directed   No lifting for 4 weeks     Resume previous diet    Complete by:  As directed      may wash over wound with mild soap and water    Complete by:  As directed   Wash the groin wound with soap and water daily and pat dry. (No tub bath-only shower)  Then put a dry gauze or washcloth there to keep this area dry daily and as needed.  Do not use Vaseline or neosporin on your incisions.  Only use soap and water on your incisions and then protect and keep dry.           Discharge Diagnosis:  AAA (abdominal aortic aneurysm, ruptured) [I71.3] Abdominal pain [R10.9]  Secondary Diagnosis: Patient Active Problem List   Diagnosis Date Noted  . AAA (abdominal aortic aneurysm, ruptured) 08/06/2014   History reviewed. No pertinent past medical history.     Medication List    TAKE these medications        amLODipine 5 MG tablet  Commonly known as:  NORVASC  Take 1 tablet (5 mg total) by mouth daily.     aspirin EC 81 MG tablet  Take 1 tablet (81 mg total) by mouth daily.     naproxen sodium 220 MG tablet  Commonly known as:  ANAPROX  Take 220 mg by mouth daily as needed (pain).     OVER THE COUNTER MEDICATION  Take 2 capsules by mouth daily as needed (congestion). "Cold Remedy"     oxyCODONE 5 MG immediate release tablet  Commonly known as:  Oxy IR/ROXICODONE  Take 1-2 tablets (5-10 mg total) by mouth every 6 (six) hours as needed for moderate pain.     simvastatin 10 MG tablet  Commonly known as:  ZOCOR  Take 1 tablet (10 mg total) by mouth daily.        Roxicodone #30 No Refill  Disposition: Home  Patient's condition: is Good  Follow up: 1. Dr.  Hart RochesterLawson in 4 weeks with CTA   Maris BergerKimberly Brailyn Delman, PA-C [facility] [phone] 08/10/2014  9:45 AM   - For VQI Registry use --- Instructions: Press F2 to tab through selections.  Delete question if not applicable.   Post-op:  Time to Extubation: [x ] In OR, [ ]  < 12 hrs, [ ]  12-24 hrs, [ ]  >=24 hrs Vasopressors Req. Post-op: No MI: No., [ ]  Troponin only, [ ]  EKG or Clinical New Arrhythmia: No CHF: No ICU Stay: 1 days Transfusion:  No   Complications: Resp failure: No., [ ]  Pneumonia, [ ]  Ventilator Chg in renal function: No., [ ]  Inc. Cr > 0.5, [ ]  Temp. Dialysis, [ ]  Permanent dialysis Leg ischemia: No., no Surgery needed, [ ]  Yes, Surgery needed, [ ]  Amputation Bowel ischemia: No., [ ]  Medical Rx, [ ]  Surgical Rx Wound complication: No., [ ]  Superficial separation/infection, [ ]  Return to OR Return to OR: No  Return to OR for bleeding: No Stroke: No., [ ]  Minor, [ ]  Major  Discharge medications: Statin use:  Yes If No: [ ]  For Medical reasons, [ ]  Non-compliant, [ ]  Not-indicated ASA use:  Yes  If No: [ ]  For Medical reasons, [ ]  Non-compliant, [ ]  Not-indicated Plavix use:  No If No: [ ]  For Medical reasons, [ ]  Non-compliant, [ ]  Not-indicated Beta blocker use:  No, started on norvasc If No: [ ]  For Medical reasons, [ ]  Non-compliant, [ ]  Not-indicated

## 2014-08-10 NOTE — Care Management Note (Signed)
    Page 1 of 1   08/10/2014     2:25:06 PM CARE MANAGEMENT NOTE 08/10/2014  Patient:  Blanchard ManeOUNDS,Lenell   Account Number:  [id number]  Date Initiated:  08/06/2014  Documentation initiated by:  MAYO,HENRIETTA  Subjective/Objective Assessment:   s/p endograft repair of aorta iliac aneurysm; lives with spouse     Action/Plan:   Anticipated DC Date:  08/11/2014   Anticipated DC Plan:  HOME/SELF CARE      DC Planning Services  CM consult  PCP issues      Choice offered to / List presented to:             Status of service:  Completed, signed off Medicare Important Message given?  NO (If response is "NO", the following Medicare IM given date fields will be blank) Date Medicare IM given:   Medicare IM given by:   Date Additional Medicare IM given:   Additional Medicare IM given by:    Discharge Disposition:  HOME/SELF CARE  Per UR Regulation:  Reviewed for med. necessity/level of care/duration of stay  If discussed at Long Length of Stay Meetings, dates discussed:    Comments:  08/10/14- 1100- Donn PieriniKristi Tawnee Clegg RN, BSN [phone] [place]- spoke with pt at bedside- as pt has [employer] insurance- gave pt [facility] # for physician referral line- to assist in finding a PCP near where he lives- pt can also call the # on the back of his insurance card to find  PCP in network where he lives. Pt understanding of this and to f/u with finding a PCP.

## 2014-08-10 NOTE — Progress Notes (Addendum)
  [facility] Progress Note  08/10/2014 8:16 AM 4 Days Post-Op  Subjective:  Doing well today. Passing flatus, had 3 BMs. Most recent was a "little watery." Right foot numbness significantly improved.   Filed Vitals:   08/10/14 0457  BP: 150/94  Pulse: 86  Temp: 98.5 F (36.9 C)  Resp: 18    Physical Exam: Incisions: Bilateral groin incisions are clean dry and intact.  Extremities:  Feet warm bilaterally and well perfused.  Abdomen: normoactive bowel sounds. Soft mild tenderness to RLQ.   CBC    Component Value Date/Time   WBC 7.9 08/10/2014 0450   RBC 3.59* 08/10/2014 0450   HGB 10.8* 08/10/2014 0450   HCT 33.4* 08/10/2014 0450   PLT 227 08/10/2014 0450   MCV 93.0 08/10/2014 0450   MCH 30.1 08/10/2014 0450   MCHC 32.3 08/10/2014 0450   RDW 13.7 08/10/2014 0450   LYMPHSABS 2.5 08/05/2014 1951   MONOABS 0.5 08/05/2014 1951   EOSABS 0.2 08/05/2014 1951   BASOSABS 0.0 08/05/2014 1951    BMET    Component Value Date/Time   NA 137 08/10/2014 0450   K 4.2 08/10/2014 0450   CL 100 08/10/2014 0450   CO2 24 08/10/2014 0450   GLUCOSE 100* 08/10/2014 0450   BUN 15 08/10/2014 0450   CREATININE 1.19 08/10/2014 0450   CALCIUM 9.4 08/10/2014 0450   GFRNONAA 63* 08/10/2014 0450   GFRAA 73* 08/10/2014 0450    INR    Component Value Date/Time   INR 1.20 08/06/2014 0650     Intake/Output Summary (Last 24 hours) at 08/10/14 0816 Last data filed at 08/09/14 2031  Gross per 24 hour  Intake      0 ml  Output    800 ml  Net   -800 ml     Assessment:  63 y.o. male is s/p: emergent endovascular repair of ruptured abdominal aortic aneurysm 4 Days Post-Op  Plan: -Incisions healing well. Right foot numbness significantly improved.  -Tolerating diet well. Has had three bowel movements.  -No signs or symptoms of ischemic colitis. -DVT prophylaxis:  Lovenox.  -Dispo: Will discharge home today. F/u in 4 weeks with CTA.    Maris BergerKimberly Trinh,  PA-C [facility] Office: [phone] Pager: [phone] 08/10/2014 8:16 AM     Abdomen soft with no significant tenderness Inguinal incisions healing nicely Both feet well perfused with ABIs 1.0 bilaterally with triphasic flow posterior tibial arteries Appetite good 3 bowel movements yesterday but no real diarrhea or bloody stools  Plan DC home today-return to see me in 4 weeks with CT angiogram Patient develops crampy abdominal pain, diarrhea, or blood in stools he will be in touch with us since ischemic colitis is a possibility but no symptoms at present time

## 2014-08-11 ENCOUNTER — Telehealth: Payer: Self-pay | Admitting: Vascular Surgery

## 2014-08-11 NOTE — Telephone Encounter (Addendum)
-----   Message from Sharee PimpleMarilyn K McChesney, RN sent at 08/10/2014 10:04 AM EST ----- Regarding: Schedule   ----- Message -----    From: Raymond GurneyKimberly A Trinh, PA-C    Sent: 08/10/2014   8:36 AM      To: [facility] Charge Pool  S/p emergent EVAR 08/06/14  F/u with Dr. Hart RochesterLawson with CTA in 4 weeks.   Thanks, Kim   08/11/14: [facility] with pt to [facility] and [facility] at 10:45 am. NO blood work needed, NO oral contrast needed. Mailed CT letter. [place] to [place] prior auth. dpm

## 2014-08-12 ENCOUNTER — Telehealth: Payer: Self-pay | Admitting: *Deleted

## 2014-08-12 NOTE — Telephone Encounter (Signed)
I called Mr. Curtis Ballard, he is doing well post EVAR for AAA rupture. His groin wounds are clean and dry. He is afebrile, has been eating some and has had 1 BM since he got home. His wife was inquiring about getting a hospital bed for him so that he can get out of bed easily. I instructed patient on splinting his abdomen with a pillow and the correct was to side roll and put his legs on the floor. He will start ambulating more around the house. He has no signs of DVT or swelling in his legs. Patient is reassured about this postop wound care. He will keep his regularly scheduled appt with Dr. Hart RochesterLawson and will get his CTA as planned. Mr. Curtis Ballard voiced understanding and agreement with these plans and will call us if he has any problems or signs of infection or DVT.

## 2014-09-07 ENCOUNTER — Telehealth: Payer: Self-pay

## 2014-09-07 ENCOUNTER — Encounter: Payer: Self-pay | Admitting: Vascular Surgery

## 2014-09-07 NOTE — Telephone Encounter (Signed)
Per Dr. Hart RochesterLawson, patient experienced 91 minutes of fluoro. during procedure on 08/06/14 to repair patient's ruptured abdominal aortic aneurysm. Patient states he is "feeling good since procedure" and is "getting better with each day". Patient continues to deny any side effects from prolonged fluoro. exposure, such as redness or "burn" to trunk area. Patient is to follow-up in the office with Dr. Hart RochesterLawson 09/08/14 for post-op visit. Emailed Patria ManeMark Pflug, [employer] safety officer, 08/11/14 for further information.

## 2014-09-08 ENCOUNTER — Other Ambulatory Visit: Payer: Self-pay

## 2014-09-08 ENCOUNTER — Ambulatory Visit (INDEPENDENT_AMBULATORY_CARE_PROVIDER_SITE_OTHER): Payer: Self-pay | Admitting: Vascular Surgery

## 2014-09-08 ENCOUNTER — Encounter: Payer: Self-pay | Admitting: Vascular Surgery

## 2014-09-08 ENCOUNTER — Ambulatory Visit
Admission: RE | Admit: 2014-09-08 | Discharge: 2014-09-08 | Disposition: A | Discharge status: Home / Self Care | Payer: BC Managed Care – PPO | Source: Ambulatory Visit | Attending: Vascular Surgery | Admitting: Vascular Surgery

## 2014-09-08 VITALS — BP 164/107 | HR 82 | Ht 75.0 in | Wt 288.4 lb

## 2014-09-08 DIAGNOSIS — Z48812 Encounter for surgical aftercare following surgery on the circulatory system: Secondary | ICD-10-CM

## 2014-09-08 DIAGNOSIS — I714 Abdominal aortic aneurysm, without rupture, unspecified: Secondary | ICD-10-CM

## 2014-09-08 DIAGNOSIS — I713 Abdominal aortic aneurysm, ruptured, unspecified: Secondary | ICD-10-CM

## 2014-09-08 DIAGNOSIS — Z95828 Presence of other vascular implants and grafts: Secondary | ICD-10-CM

## 2014-09-08 DIAGNOSIS — I723 Aneurysm of iliac artery: Secondary | ICD-10-CM

## 2014-09-08 MED ORDER — IOHEXOL 350 MG/ML SOLN
75.0000 mL | Freq: Once | INTRAVENOUS | Status: AC | PRN
  Administered 2014-09-08: 75 mL via INTRAVENOUS

## 2014-09-08 NOTE — Progress Notes (Signed)
Subjective:     Patient ID: Curtis Ballard, male   DOB: 04/27/1951, 63 y.o.   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 This was treated with an aorto bi-external iliac artery gore C3 stent graft and required embolization of bilateral internal iliac arteries with coils. He also presented with thrombosis of the right iliac system when the aneurysm ruptured. He has done well postoperatively. He has some mild buttock discomfort on occasion. He denies classic calf claudication symptoms. His appetite is normal and he is resuming his usual activities. He has had no loose bowel movements or bloody diarrhea.  No past medical history on file.  History  Substance Use Topics  . Smoking status: Current Every Day Smoker -- 0.50 packs/day for 20 years    Types: Cigarettes  . Smokeless tobacco: Not on file  . Alcohol Use: No    No family history on file.  No Known Allergies  Current outpatient prescriptions: amLODipine (NORVASC) 5 MG tablet, Take 1 tablet (5 mg total) by mouth daily., Disp: 30 tablet, Rfl: 3;  aspirin EC 81 MG tablet, Take 1 tablet (81 mg total) by mouth daily., Disp: , Rfl: ;  naproxen sodium (ANAPROX) 220 MG tablet, Take 220 mg by mouth daily as needed (pain)., Disp: , Rfl: ;  OVER THE COUNTER MEDICATION, Take 2 capsules by mouth daily as needed (congestion). "Cold Remedy", Disp: , Rfl:  simvastatin (ZOCOR) 10 MG tablet, Take 1 tablet (10 mg total) by mouth daily., Disp: 30 tablet, Rfl: 6;  oxyCODONE (OXY IR/ROXICODONE) 5 MG immediate release tablet, Take 1-2 tablets (5-10 mg total) by mouth every 6 (six) hours as needed for moderate pain. (Patient not taking: Reported on 09/08/2014), Disp: 30 tablet, Rfl: 0  BP 164/107 mmHg  Pulse 82  Ht 6\' 3"  (1.905 m)  Wt 288 lb 6.4 oz (130.817 kg)  BMI 36.05 kg/m2  SpO2 96%  Body mass index is 36.05 kg/(m^2).           Review of  Systems denies chest pain, dyspnea on exertion, PND, orthopnea, hemoptysis     Objective:   Physical Exam BP 164/107 mmHg  Pulse 82  Ht 6\' 3"  (1.905 m)  Wt 288 lb 6.4 oz (130.817 kg)  BMI 36.05 kg/m2  SpO2 96%  Gen. obese male no apparent distress alert and oriented 3 Lungs no rhonchi or wheezing Abdomen obese no palpable mass Inguinal incisions well healed with 3+ femoral and 2+ dorsalis pedis pulse palpable bilaterally  Today I ordered a CT angiogram of the abdomen and pelvis which I reviewed by computer. Stent graft is in excellent position beginning at the renal arteries extending to both external iliac arteries. There may be a faint type II endoleak in the left common iliac artery aneurysm but this is debatable. There is evidence of bilateral internal iliac artery coil embolization.     Assessment:     5 weeks post ruptured abdominal aortic aneurysm with bilateral large common iliac artery aneurysms requiring bilateral internal iliac coil embolization and aortic-bilateral external iliac artery stent grafting Patient doing very well postoperatively with no specific complaints    Plan:     Return in 6 months with repeat CT angiogram of abdomen and pelvis and check bilateral ABIs.

## 2015-03-04 ENCOUNTER — Other Ambulatory Visit: Payer: Self-pay | Admitting: *Deleted

## 2015-03-04 DIAGNOSIS — I713 Abdominal aortic aneurysm, ruptured, unspecified: Secondary | ICD-10-CM

## 2015-03-04 DIAGNOSIS — Z48812 Encounter for surgical aftercare following surgery on the circulatory system: Secondary | ICD-10-CM

## 2015-03-12 ENCOUNTER — Encounter: Payer: Self-pay | Admitting: Vascular Surgery

## 2015-03-15 ENCOUNTER — Other Ambulatory Visit: Payer: Self-pay | Admitting: *Deleted

## 2015-03-15 DIAGNOSIS — Z01812 Encounter for preprocedural laboratory examination: Secondary | ICD-10-CM

## 2015-03-16 ENCOUNTER — Encounter (HOSPITAL_COMMUNITY): Payer: BLUE CROSS/BLUE SHIELD

## 2015-03-16 ENCOUNTER — Inpatient Hospital Stay: Admission: RE | Admit: 2015-03-16 | Payer: BC Managed Care – PPO | Source: Ambulatory Visit

## 2015-03-16 ENCOUNTER — Ambulatory Visit: Payer: BLUE CROSS/BLUE SHIELD | Admitting: Vascular Surgery

## 2015-03-24 ENCOUNTER — Ambulatory Visit
Admission: RE | Admit: 2015-03-24 | Discharge: 2015-03-24 | Disposition: A | Discharge status: Home / Self Care | Payer: BLUE CROSS/BLUE SHIELD | Source: Ambulatory Visit | Attending: Vascular Surgery | Admitting: Vascular Surgery

## 2015-03-24 DIAGNOSIS — I714 Abdominal aortic aneurysm, without rupture, unspecified: Secondary | ICD-10-CM

## 2015-03-24 DIAGNOSIS — Z48812 Encounter for surgical aftercare following surgery on the circulatory system: Secondary | ICD-10-CM

## 2015-03-24 MED ORDER — IOPAMIDOL (ISOVUE-370) INJECTION 76%
100.0000 mL | Freq: Once | INTRAVENOUS | Status: AC | PRN
  Administered 2015-03-24: 100 mL via INTRAVENOUS

## 2015-04-06 ENCOUNTER — Encounter (HOSPITAL_COMMUNITY): Payer: BLUE CROSS/BLUE SHIELD

## 2015-04-06 ENCOUNTER — Ambulatory Visit: Payer: BLUE CROSS/BLUE SHIELD | Admitting: Vascular Surgery

## 2015-05-03 ENCOUNTER — Encounter: Payer: Self-pay | Admitting: Vascular Surgery

## 2015-05-04 ENCOUNTER — Encounter: Payer: Self-pay | Admitting: Vascular Surgery

## 2015-05-04 ENCOUNTER — Ambulatory Visit (INDEPENDENT_AMBULATORY_CARE_PROVIDER_SITE_OTHER): Payer: BLUE CROSS/BLUE SHIELD | Admitting: Vascular Surgery

## 2015-05-04 ENCOUNTER — Ambulatory Visit (HOSPITAL_COMMUNITY)
Admission: RE | Admit: 2015-05-04 | Discharge: 2015-05-04 | Disposition: A | Discharge status: Home / Self Care | Payer: BLUE CROSS/BLUE SHIELD | Source: Ambulatory Visit | Attending: Vascular Surgery | Admitting: Vascular Surgery

## 2015-05-04 VITALS — BP 182/127 | HR 57 | Ht 75.0 in | Wt 302.3 lb

## 2015-05-04 DIAGNOSIS — I713 Abdominal aortic aneurysm, ruptured, unspecified: Secondary | ICD-10-CM

## 2015-05-04 DIAGNOSIS — Z48812 Encounter for surgical aftercare following surgery on the circulatory system: Secondary | ICD-10-CM | POA: Diagnosis present

## 2015-05-04 DIAGNOSIS — M79673 Pain in unspecified foot: Secondary | ICD-10-CM | POA: Diagnosis not present

## 2015-05-04 NOTE — Progress Notes (Signed)
Subjective:     Patient ID: Curtis Ballard, male   DOB: [birth year], 64 y.o.   MRN: [id number]  HPI this 64 year old male returns for continued follow-up regarding his urgent surgery for ruptured abdominal aortic aneurysm on 08/06/2014. He had a large aortic aneurysm and bilateral large common iliac artery aneurysms with the right common iliac artery aneurysm being thrombosed. He had an aorto by external iliac graft with embolization of both internal iliac arteries. He has done well since that time from that standpoint. He denies any abdominal or back symptoms. He is able to ambulate without claudication.  Past Medical History  Diagnosis Date  . AAA (abdominal aortic aneurysm)     Social History  Substance Use Topics  . Smoking status: Current Every Day Smoker -- 0.50 packs/day for 20 years    Types: Cigarettes  . Smokeless tobacco: Not on file  . Alcohol Use: No    Family History  Problem Relation Age of Onset  . Cancer Mother     No Known Allergies   Current outpatient prescriptions:  .  aspirin EC 81 MG tablet, Take 1 tablet (81 mg total) by mouth daily., Disp: , Rfl:  .  naproxen sodium (ANAPROX) 220 MG tablet, Take 220 mg by mouth daily as needed (pain)., Disp: , Rfl:  .  OVER THE COUNTER MEDICATION, Take 2 capsules by mouth daily as needed (congestion). "Cold Remedy", Disp: , Rfl:  .  simvastatin (ZOCOR) 10 MG tablet, Take 1 tablet (10 mg total) by mouth daily., Disp: 30 tablet, Rfl: 6 .  amLODipine (NORVASC) 5 MG tablet, Take 1 tablet (5 mg total) by mouth daily. (Patient not taking: Reported on 05/04/2015), Disp: 30 tablet, Rfl: 3 .  oxyCODONE (OXY IR/ROXICODONE) 5 MG immediate release tablet, Take 1-2 tablets (5-10 mg total) by mouth every 6 (six) hours as needed for moderate pain. (Patient not taking: Reported on 05/04/2015), Disp: 30 tablet, Rfl: 0  Filed Vitals:   05/04/15 1515 05/04/15 1517  BP: 182/133 182/127  Pulse: 57   Height: 6\' 3"  (1.905 m)   Weight: 302 lb 4.8 oz  (137.122 kg)   SpO2: 94%     Body mass index is 37.78 kg/(m^2).           Review of Systems has occasional chest discomfort and dyspnea on exertion. No PND or orthopnea. Does not have regular PCP. Has high blood pressure and advised to go see medical doctor or go to health clinic in his area.     Objective:   Physical Exam BP 182/127 mmHg  Pulse 57  Ht 6\' 3"  (1.905 m)  Wt 302 lb 4.8 oz (137.122 kg)  BMI 37.78 kg/m2  SpO2 94%  Gen. obese male no apparent distress alert and oriented 3 Lungs no rhonchi or wheezing Cardiovascular regular rhythm no murmurs Abdomen obese no palpable mass area Extremities 3+ femoral and 2+ dorsalis pedis pulse palpable bilaterally. 1+ edema bilaterally.  Today I reviewed the CT angiogram of the abdomen and pelvis by computer. The stent grafts in excellent position. There is no evidence of endoleak. The diameter of the aortic aneurysm and bilateral iliac aneurysms is unchanged from previous study 6 months ago.     Assessment:     Doing well 8 months post emergency aorto bi-external iliac stent graft for ruptured abdominal aortic aneurysm and bilateral common iliac artery aneurysms with embolization by lateral internal iliac arteries    Plan:     Return in 6 months with a piece  CT angiogram of abdomen and pelvis Patient advised to go to health clinic to see PCP for blood pressure control

## 2015-05-06 ENCOUNTER — Other Ambulatory Visit: Payer: Self-pay

## 2015-05-06 DIAGNOSIS — I713 Abdominal aortic aneurysm, ruptured, unspecified: Secondary | ICD-10-CM

## 2015-05-07 ENCOUNTER — Other Ambulatory Visit: Payer: Self-pay

## 2015-05-07 ENCOUNTER — Encounter: Payer: Self-pay | Admitting: Emergency Medicine

## 2015-05-07 ENCOUNTER — Emergency Department
Admission: EM | Admit: 2015-05-07 | Discharge: 2015-05-07 | Disposition: A | Discharge status: Home / Self Care | Payer: BLUE CROSS/BLUE SHIELD | Attending: Emergency Medicine | Admitting: Emergency Medicine

## 2015-05-07 DIAGNOSIS — Z79899 Other long term (current) drug therapy: Secondary | ICD-10-CM | POA: Diagnosis not present

## 2015-05-07 DIAGNOSIS — Z720 Tobacco use: Secondary | ICD-10-CM | POA: Insufficient documentation

## 2015-05-07 DIAGNOSIS — I10 Essential (primary) hypertension: Secondary | ICD-10-CM | POA: Diagnosis not present

## 2015-05-07 DIAGNOSIS — Z7982 Long term (current) use of aspirin: Secondary | ICD-10-CM | POA: Insufficient documentation

## 2015-05-07 DIAGNOSIS — R079 Chest pain, unspecified: Secondary | ICD-10-CM | POA: Insufficient documentation

## 2015-05-07 HISTORY — DX: Essential (primary) hypertension: I10

## 2015-05-07 LAB — CBC WITH DIFFERENTIAL/PLATELET
Basophils Absolute: 0 10*3/uL (ref 0–0.1)
Basophils Relative: 1 %
EOS ABS: 0.1 10*3/uL (ref 0–0.7)
EOS PCT: 2 %
HCT: 56.2 % — ABNORMAL HIGH (ref 40.0–52.0)
Hemoglobin: 18.4 g/dL — ABNORMAL HIGH (ref 13.0–18.0)
LYMPHS ABS: 1.9 10*3/uL (ref 1.0–3.6)
Lymphocytes Relative: 32 %
MCH: 30.7 pg (ref 26.0–34.0)
MCHC: 32.8 g/dL (ref 32.0–36.0)
MCV: 93.6 fL (ref 80.0–100.0)
Monocytes Absolute: 0.5 10*3/uL (ref 0.2–1.0)
Monocytes Relative: 8 %
Neutro Abs: 3.5 10*3/uL (ref 1.4–6.5)
Neutrophils Relative %: 57 %
Platelets: 211 10*3/uL (ref 150–440)
RBC: 6 MIL/uL — ABNORMAL HIGH (ref 4.40–5.90)
RDW: 14.6 % — ABNORMAL HIGH (ref 11.5–14.5)
WBC: 6 10*3/uL (ref 3.8–10.6)

## 2015-05-07 LAB — COMPREHENSIVE METABOLIC PANEL
ALT: 40 U/L (ref 17–63)
AST: 31 U/L (ref 15–41)
Albumin: 4.1 g/dL (ref 3.5–5.0)
Alkaline Phosphatase: 90 U/L (ref 38–126)
Anion gap: 7 (ref 5–15)
BILIRUBIN TOTAL: 0.5 mg/dL (ref 0.3–1.2)
BUN: 16 mg/dL (ref 6–20)
CALCIUM: 9.8 mg/dL (ref 8.9–10.3)
CHLORIDE: 107 mmol/L (ref 101–111)
CO2: 26 mmol/L (ref 22–32)
CREATININE: 1.28 mg/dL — AB (ref 0.61–1.24)
GFR calc Af Amer: >60 mL/min (ref >60)
GFR, EST NON AFRICAN AMERICAN: 58 mL/min — AB (ref >60)
Glucose, Bld: 93 mg/dL (ref 65–99)
Potassium: 4.5 mmol/L (ref 3.5–5.1)
Sodium: 140 mmol/L (ref 135–145)
TOTAL PROTEIN: 8.4 g/dL — AB (ref 6.5–8.1)

## 2015-05-07 LAB — TROPONIN I: Troponin I: <0.03 ng/mL (ref <0.031)

## 2015-05-07 MED ORDER — AMLODIPINE BESYLATE 5 MG PO TABS
10.0000 mg | ORAL_TABLET | Freq: Once | ORAL | Status: AC
  Administered 2015-05-07: 10 mg via ORAL

## 2015-05-07 MED ORDER — AMLODIPINE BESYLATE 5 MG PO TABS
ORAL_TABLET | ORAL | Status: AC
  Filled 2015-05-07: qty 2

## 2015-05-07 MED ORDER — AMLODIPINE BESYLATE 10 MG PO TABS: 10.0000 mg | ORAL_TABLET | Freq: Once | ORAL | Status: DC

## 2015-05-07 NOTE — ED Notes (Signed)
Pt with chest pressure x1 month, was noted at a follow up appointment this week to have HTN,

## 2015-05-07 NOTE — Discharge Instructions (Signed)
Take amlodipine as prescribed. Follow-up with [facility] or another doctor next week. Return to the emergency department if you have further chest pain, if you have shortness of breath, or if you have other urgent concerns.  Chest Pain (Nonspecific) It is often hard to give a specific diagnosis for the cause of chest pain. There is always a chance that your pain could be related to something serious, such as a heart attack or a blood clot in the lungs. You need to follow up with your health care provider for further evaluation. CAUSES   Heartburn.  Pneumonia or bronchitis.  Anxiety or stress.  Inflammation around your heart (pericarditis) or lung (pleuritis or pleurisy).  A blood clot in the lung.  A collapsed lung (pneumothorax). It can develop suddenly on its own (spontaneous pneumothorax) or from trauma to the chest.  Shingles infection (herpes zoster virus). The chest wall is composed of bones, muscles, and cartilage. Any of these can be the source of the pain.  The bones can be bruised by injury.  The muscles or cartilage can be strained by coughing or overwork.  The cartilage can be affected by inflammation and become sore (costochondritis). DIAGNOSIS  Lab tests or other studies may be needed to find the cause of your pain. Your health care provider may have you take a test called an ambulatory electrocardiogram (ECG). An ECG records your heartbeat patterns over a 24-hour period. You may also have other tests, such as:  Transthoracic echocardiogram (TTE). During echocardiography, sound waves are used to evaluate how blood flows through your heart.  Transesophageal echocardiogram (TEE).  Cardiac monitoring. This allows your health care provider to monitor your heart rate and rhythm in real time.  Holter monitor. This is a portable device that records your heartbeat and can help diagnose heart arrhythmias. It allows your health care provider to track your heart activity for  several days, if needed.  Stress tests by exercise or by giving medicine that makes the heart beat faster. TREATMENT   Treatment depends on what may be causing your chest pain. Treatment may include:  Acid blockers for heartburn.  Anti-inflammatory medicine.  Pain medicine for inflammatory conditions.  Antibiotics if an infection is present.  You may be advised to change lifestyle habits. This includes stopping smoking and avoiding alcohol, caffeine, and chocolate.  You may be advised to keep your head raised (elevated) when sleeping. This reduces the chance of acid going backward from your stomach into your esophagus. Most of the time, nonspecific chest pain will improve within 2-3 days with rest and mild pain medicine.  HOME CARE INSTRUCTIONS   If antibiotics were prescribed, take them as directed. Finish them even if you start to feel better.  For the next few days, avoid physical activities that bring on chest pain. Continue physical activities as directed.  Do not use any tobacco products, including cigarettes, chewing tobacco, or electronic cigarettes.  Avoid drinking alcohol.  Only take medicine as directed by your health care provider.  Follow your health care provider's suggestions for further testing if your chest pain does not go away.  Keep any follow-up appointments you made. If you do not go to an appointment, you could develop lasting (chronic) problems with pain. If there is any problem keeping an appointment, call to reschedule. SEEK MEDICAL CARE IF:   Your chest pain does not go away, even after treatment.  You have a rash with blisters on your chest.  You have a fever. SEEK  IMMEDIATE MEDICAL CARE IF:   You have increased chest pain or pain that spreads to your arm, neck, jaw, back, or abdomen.  You have shortness of breath.  You have an increasing cough, or you cough up blood.  You have severe back or abdominal pain.  You feel nauseous or  vomit.  You have severe weakness.  You faint.  You have chills. This is an emergency. Do not wait to see if the pain will go away. Get medical help at once. Call your local emergency services (911 in [place] Do not drive yourself to the hospital. MAKE SURE YOU:   Understand these instructions.  Will watch your condition.  Will get help right away if you are not doing well or get worse. Document Released: 06/14/2005 Document Revised: 09/09/2013 Document Reviewed: 04/09/2008 [facility] Patient Information 2015 [place], [place]. This information is not intended to replace advice given to you by your health care provider. Make sure you discuss any questions you have with your health care provider.  Hypertension Hypertension, commonly called high blood pressure, is when the force of blood pumping through your arteries is too strong. Your arteries are the blood vessels that carry blood from your heart throughout your body. A blood pressure reading consists of a higher number over a lower number, such as 110/72. The higher number (systolic) is the pressure inside your arteries when your heart pumps. The lower number (diastolic) is the pressure inside your arteries when your heart relaxes. Ideally you want your blood pressure below 120/80. Hypertension forces your heart to work harder to pump blood. Your arteries may become narrow or stiff. Having hypertension puts you at risk for heart disease, stroke, and other problems.  RISK FACTORS Some risk factors for high blood pressure are controllable. Others are not.  Risk factors you cannot control include:   Race. You may be at higher risk if you are African American.  Age. Risk increases with age.  Gender. Men are at higher risk than women before age 63 years. After age 74, women are at higher risk than men. Risk factors you can control include:  Not getting enough exercise or physical activity.  Being overweight.  Getting too much fat, sugar,  calories, or salt in your diet.  Drinking too much alcohol. SIGNS AND SYMPTOMS Hypertension does not usually cause signs or symptoms. Extremely high blood pressure (hypertensive crisis) may cause headache, anxiety, shortness of breath, and nosebleed. DIAGNOSIS  To check if you have hypertension, your health care provider will measure your blood pressure while you are seated, with your arm held at the level of your heart. It should be measured at least twice using the same arm. Certain conditions can cause a difference in blood pressure between your right and left arms. A blood pressure reading that is higher than normal on one occasion does not mean that you need treatment. If one blood pressure reading is high, ask your health care provider about having it checked again. TREATMENT  Treating high blood pressure includes making lifestyle changes and possibly taking medicine. Living a healthy lifestyle can help lower high blood pressure. You may need to change some of your habits. Lifestyle changes may include:  Following the DASH diet. This diet is high in fruits, vegetables, and whole grains. It is low in salt, red meat, and added sugars.  Getting at least 2 hours of brisk physical activity every week.  Losing weight if necessary.  Not smoking.  Limiting alcoholic beverages.  Learning ways  to reduce stress. If lifestyle changes are not enough to get your blood pressure under control, your health care provider may prescribe medicine. You may need to take more than one. Work closely with your health care provider to understand the risks and benefits. HOME CARE INSTRUCTIONS  Have your blood pressure rechecked as directed by your health care provider.   Take medicines only as directed by your health care provider. Follow the directions carefully. Blood pressure medicines must be taken as prescribed. The medicine does not work as well when you skip doses. Skipping doses also puts you at risk  for problems.   Do not smoke.   Monitor your blood pressure at home as directed by your health care provider. SEEK MEDICAL CARE IF:   You think you are having a reaction to medicines taken.  You have recurrent headaches or feel dizzy.  You have swelling in your ankles.  You have trouble with your vision. SEEK IMMEDIATE MEDICAL CARE IF:  You develop a severe headache or confusion.  You have unusual weakness, numbness, or feel faint.  You have severe chest or abdominal pain.  You vomit repeatedly.  You have trouble breathing. MAKE SURE YOU:   Understand these instructions.  Will watch your condition.  Will get help right away if you are not doing well or get worse. Document Released: 09/04/2005 Document Revised: 01/19/2014 Document Reviewed: 06/27/2013 [facility] Patient Information 2015 [place], [place]. This information is not intended to replace advice given to you by your health care provider. Make sure you discuss any questions you have with your health care provider.

## 2015-05-07 NOTE — ED Provider Notes (Signed)
[facility] Emergency Department Provider Note  ____________________________________________  Time seen: 1945  I have reviewed the triage vital signs and the nursing notes.   HISTORY  Chief Complaint Chest Pain  hypertension     HPI Curtis Ballard is a 64 y.o. male who is a history of hypertension as well as facet disease. He had a perception written by the vascular surgeon who placed his aortic stent last year but now his refills have run out. He has not been on his blood pressure medication for 1 month. He arrives out of concern for his high blood pressure and in addition, notes that he has a little bit of discomfort in a small area in his lower chest.  This is intermittent, it occurs more often when he is up and moving. It stops when he lies down again. The last time he had this episode was 12 noon today. It lasted less than 5 minutes.  The patient denies any shortness of breath. He does have a history of tobacco use.   He is attempting to follow up with another physician. He reports he is getting good [facility] and has an appointment in September. Due to a recent blood pressure measurement, he did not want to wait until then to be seen and to receive further treatment.     Past Medical History  Diagnosis Date  . AAA (abdominal aortic aneurysm)   . Hypertension     Patient Active Problem List   Diagnosis Date Noted  . 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 AAA (abdominal aortic aneurysm, ruptured) 08/06/2014    Past Surgical History  Procedure Laterality Date  . Abdominal aortic endovascular stent graft N/A 08/06/2014    Procedure: Abdominal Aortic Endovascular Stent Graft; Embolization and Coiling of Right and Left Internal Iliac Artery;  Surgeon: Pryor Ochoa, MD;  Location: [facility] OR;  Service: Vascular;  Laterality: N/A;  . Abdominal surgery      Current Outpatient Rx  Name  Route  Sig  Dispense  Refill  . simvastatin (ZOCOR) 10 MG  tablet   Oral   Take 1 tablet (10 mg total) by mouth daily.   30 tablet   6   . amLODipine (NORVASC) 10 MG tablet   Oral   Take 1 tablet (10 mg total) by mouth once.   30 tablet   0   . amLODipine (NORVASC) 5 MG tablet   Oral   Take 1 tablet (5 mg total) by mouth daily. Patient not taking: Reported on 05/04/2015   30 tablet   3   . aspirin EC 81 MG tablet   Oral   Take 1 tablet (81 mg total) by mouth daily. Patient not taking: Reported on 05/07/2015           Allergies Review of patient's allergies indicates no known allergies.  Family History  Problem Relation Age of Onset  . Cancer Mother     Social History Social History  Substance Use Topics  . Smoking status: Current Every Day Smoker -- 0.50 packs/day for 20 years    Types: Cigarettes  . Smokeless tobacco: None  . Alcohol Use: No    Review of Systems  Constitutional: Negative for fever. ENT: Negative for sore throat. Cardiovascular: Notable high blood pressure and some chest pain today. See history of present illness Respiratory: Negative for shortness of breath. Gastrointestinal: Negative for abdominal pain, vomiting and diarrhea. Genitourinary: Negative for dysuria. Musculoskeletal: No myalgias or injuries. Skin: Negative  for rash. Neurological: Negative for headaches   10-point ROS otherwise negative.  ____________________________________________   PHYSICAL EXAM:  VITAL SIGNS: ED Triage Vitals  Enc Vitals Group     BP 05/07/15 1716 209/129 mmHg     Pulse Rate 05/07/15 1716 76     Resp 05/07/15 1716 18     Temp 05/07/15 1716 98.2 F (36.8 C)     Temp Source 05/07/15 1716 Oral     SpO2 05/07/15 1716 99 %     Weight 05/07/15 1716 308 lb (139.708 kg)     Height 05/07/15 1716  (1.905 m)     Head Cir --      Peak Flow --      Pain Score --      Pain Loc --      Pain Edu? --      Excl. in GC? --     Constitutional: Alert and oriented. Well appearing and in no distress. ENT    Head: Normocephalic and atraumatic.   Nose: No congestion/rhinnorhea. Cardiovascular: Normal rate, regular rhythm, no murmur noted Respiratory:  Normal respiratory effort, no tachypnea.    Breath sounds are clear and equal bilaterally.  Gastrointestinal: Soft and nontender. No distention.  Back: No muscle spasm, no tenderness, no CVA tenderness. Musculoskeletal: No deformity noted. Nontender with normal range of motion in all extremities.  No noted edema. Neurologic:  Normal speech and language. No gross focal neurologic deficits are appreciated.  Skin:  Skin is warm, dry. No rash noted. Psychiatric: Mood and affect are normal. Speech and behavior are normal.  ____________________________________________    LABS (pertinent positives/negatives)  Labs Reviewed  COMPREHENSIVE METABOLIC PANEL - Abnormal; Notable for the following:    Creatinine, Ser 1.28 (*)    Total Protein 8.4 (*)    GFR calc non Af Amer 58 (*)    All other components within normal limits  CBC WITH DIFFERENTIAL/PLATELET - Abnormal; Notable for the following:    RBC 6.00 (*)    Hemoglobin 18.4 (*)    HCT 56.2 (*)    RDW 14.6 (*)    All other components within normal limits  TROPONIN I     ____________________________________________   EKG  ED ECG REPORT I, Vicy Medico W, the attending physician, personally viewed and interpreted this ECG.   Date: 05/07/2015  EKG Time: 1727  Rate: 60  Rhythm: Normal sinus rhythm  Axis: Normal  Intervals: Normal  ST&T Change: None noted  ____________________________________________   INITIAL IMPRESSION / ASSESSMENT AND PLAN / ED COURSE  Pertinent labs & imaging results that were available during my care of the patient were reviewed by me and considered in my medical decision making (see chart for details).  Patient with notable hypertension today. He ordinarily takes amlodipine. We have treated him with a 10 mg tablet. His blood pressure has eased down a  small amount. He is in no acute distress. Already prescription for ongoing amlodipine. He will follow-up with [facility] as originally planned or sooner.  ____________________________________________   FINAL CLINICAL IMPRESSION(S) / ED DIAGNOSES  Final diagnoses:  Essential hypertension  Chest pain on exertion      Darien Ramus, MD 05/07/15 2002

## 2015-05-14 ENCOUNTER — Observation Stay
Admission: EM | Admit: 2015-05-14 | Discharge: 2015-05-18 | Disposition: A | Discharge status: Home / Self Care | Payer: BLUE CROSS/BLUE SHIELD | Attending: Internal Medicine | Admitting: Internal Medicine

## 2015-05-14 ENCOUNTER — Emergency Department: Payer: BLUE CROSS/BLUE SHIELD

## 2015-05-14 ENCOUNTER — Encounter: Payer: Self-pay | Admitting: Emergency Medicine

## 2015-05-14 ENCOUNTER — Other Ambulatory Visit: Payer: Self-pay

## 2015-05-14 DIAGNOSIS — R0789 Other chest pain: Secondary | ICD-10-CM

## 2015-05-14 DIAGNOSIS — I371 Nonrheumatic pulmonary valve insufficiency: Secondary | ICD-10-CM | POA: Diagnosis not present

## 2015-05-14 DIAGNOSIS — I10 Essential (primary) hypertension: Secondary | ICD-10-CM | POA: Insufficient documentation

## 2015-05-14 DIAGNOSIS — I723 Aneurysm of iliac artery: Secondary | ICD-10-CM | POA: Diagnosis not present

## 2015-05-14 DIAGNOSIS — R748 Abnormal levels of other serum enzymes: Secondary | ICD-10-CM | POA: Insufficient documentation

## 2015-05-14 DIAGNOSIS — I208 Other forms of angina pectoris: Secondary | ICD-10-CM | POA: Diagnosis present

## 2015-05-14 DIAGNOSIS — I071 Rheumatic tricuspid insufficiency: Secondary | ICD-10-CM | POA: Insufficient documentation

## 2015-05-14 DIAGNOSIS — Z87891 Personal history of nicotine dependence: Secondary | ICD-10-CM | POA: Insufficient documentation

## 2015-05-14 DIAGNOSIS — R0602 Shortness of breath: Secondary | ICD-10-CM | POA: Insufficient documentation

## 2015-05-14 DIAGNOSIS — Z8679 Personal history of other diseases of the circulatory system: Secondary | ICD-10-CM | POA: Insufficient documentation

## 2015-05-14 DIAGNOSIS — R61 Generalized hyperhidrosis: Secondary | ICD-10-CM | POA: Insufficient documentation

## 2015-05-14 DIAGNOSIS — Z7982 Long term (current) use of aspirin: Secondary | ICD-10-CM | POA: Diagnosis not present

## 2015-05-14 DIAGNOSIS — Z79899 Other long term (current) drug therapy: Secondary | ICD-10-CM | POA: Insufficient documentation

## 2015-05-14 DIAGNOSIS — R079 Chest pain, unspecified: Principal | ICD-10-CM | POA: Diagnosis present

## 2015-05-14 DIAGNOSIS — Z8249 Family history of ischemic heart disease and other diseases of the circulatory system: Secondary | ICD-10-CM | POA: Diagnosis not present

## 2015-05-14 DIAGNOSIS — I340 Nonrheumatic mitral (valve) insufficiency: Secondary | ICD-10-CM | POA: Insufficient documentation

## 2015-05-14 DIAGNOSIS — I25110 Atherosclerotic heart disease of native coronary artery with unstable angina pectoris: Secondary | ICD-10-CM | POA: Diagnosis not present

## 2015-05-14 DIAGNOSIS — I259 Chronic ischemic heart disease, unspecified: Secondary | ICD-10-CM | POA: Insufficient documentation

## 2015-05-14 DIAGNOSIS — Z809 Family history of malignant neoplasm, unspecified: Secondary | ICD-10-CM | POA: Insufficient documentation

## 2015-05-14 LAB — BASIC METABOLIC PANEL
ANION GAP: 7 (ref 5–15)
BUN: 15 mg/dL (ref 6–20)
CALCIUM: 9.6 mg/dL (ref 8.9–10.3)
CO2: 26 mmol/L (ref 22–32)
CREATININE: 1.26 mg/dL — AB (ref 0.61–1.24)
Chloride: 104 mmol/L (ref 101–111)
GFR, EST NON AFRICAN AMERICAN: 59 mL/min — AB (ref >60)
Glucose, Bld: 115 mg/dL — ABNORMAL HIGH (ref 65–99)
Potassium: 4.1 mmol/L (ref 3.5–5.1)
SODIUM: 137 mmol/L (ref 135–145)

## 2015-05-14 LAB — TROPONIN I: TROPONIN I: 0.06 ng/mL — AB (ref <0.031)

## 2015-05-14 LAB — CBC
HCT: 53.8 % — ABNORMAL HIGH (ref 40.0–52.0)
HEMOGLOBIN: 17.7 g/dL (ref 13.0–18.0)
MCH: 30.6 pg (ref 26.0–34.0)
MCHC: 32.9 g/dL (ref 32.0–36.0)
MCV: 93.1 fL (ref 80.0–100.0)
PLATELETS: 215 10*3/uL (ref 150–440)
RBC: 5.78 MIL/uL (ref 4.40–5.90)
RDW: 14.2 % (ref 11.5–14.5)
WBC: 5.2 10*3/uL (ref 3.8–10.6)

## 2015-05-14 MED ORDER — ENOXAPARIN SODIUM 40 MG/0.4ML ~~LOC~~ SOLN
40.0000 mg | SUBCUTANEOUS | Status: DC
  Administered 2015-05-14 – 2015-05-16 (×3): 40 mg via SUBCUTANEOUS
  Filled 2015-05-14 (×3): qty 0.4

## 2015-05-14 MED ORDER — ASPIRIN 81 MG PO CHEW
324.0000 mg | CHEWABLE_TABLET | Freq: Once | ORAL | Status: AC
  Administered 2015-05-14: 324 mg via ORAL
  Filled 2015-05-14: qty 4

## 2015-05-14 MED ORDER — METOPROLOL TARTRATE 25 MG PO TABS
25.0000 mg | ORAL_TABLET | Freq: Two times a day (BID) | ORAL | Status: DC
  Administered 2015-05-15 – 2015-05-18 (×4): 25 mg via ORAL
  Filled 2015-05-14 (×6): qty 1

## 2015-05-14 MED ORDER — ACETAMINOPHEN 650 MG RE SUPP: 650.0000 mg | Freq: Four times a day (QID) | RECTAL | Status: DC | PRN

## 2015-05-14 MED ORDER — ASPIRIN EC 81 MG PO TBEC
81.0000 mg | DELAYED_RELEASE_TABLET | Freq: Every day | ORAL | Status: DC
  Administered 2015-05-15 – 2015-05-16 (×2): 81 mg via ORAL
  Filled 2015-05-14 (×3): qty 1

## 2015-05-14 MED ORDER — ONDANSETRON HCL 4 MG PO TABS: 4.0000 mg | ORAL_TABLET | Freq: Four times a day (QID) | ORAL | Status: DC | PRN

## 2015-05-14 MED ORDER — SODIUM CHLORIDE 0.9 % IJ SOLN
3.0000 mL | Freq: Two times a day (BID) | INTRAMUSCULAR | Status: DC
  Administered 2015-05-14 – 2015-05-16 (×5): 3 mL via INTRAVENOUS

## 2015-05-14 MED ORDER — AMLODIPINE BESYLATE 10 MG PO TABS
10.0000 mg | ORAL_TABLET | Freq: Every day | ORAL | Status: DC
  Administered 2015-05-15 – 2015-05-18 (×4): 10 mg via ORAL
  Filled 2015-05-14 (×4): qty 1

## 2015-05-14 MED ORDER — ONDANSETRON HCL 4 MG/2ML IJ SOLN: 4.0000 mg | Freq: Four times a day (QID) | INTRAMUSCULAR | Status: DC | PRN

## 2015-05-14 MED ORDER — ACETAMINOPHEN 325 MG PO TABS: 650.0000 mg | ORAL_TABLET | Freq: Four times a day (QID) | ORAL | Status: DC | PRN

## 2015-05-14 MED ORDER — MORPHINE SULFATE (PF) 2 MG/ML IV SOLN: 2.0000 mg | INTRAVENOUS | Status: DC | PRN

## 2015-05-14 MED ORDER — NITROGLYCERIN 0.4 MG SL SUBL: 0.4000 mg | SUBLINGUAL_TABLET | SUBLINGUAL | Status: DC | PRN

## 2015-05-14 NOTE — ED Notes (Signed)
Patient to ED from [facility] with chest discomfort x1 month, patient reports having been evaluated here last week and was going today for follow up. Reports he was brought over due to continued high BP and continued discomfort.

## 2015-05-14 NOTE — ED Provider Notes (Signed)
[facility] Emergency Department Provider Note  Time seen: 7:33 PM  I have reviewed the triage vital signs and the nursing notes.   HISTORY  Chief Complaint Chest Pain and Shortness of Breath    HPI Curtis Ballard is a 64 y.o. male with a past medical history of hypertension presents the emergency department for chest pain times one month. According to the patient for the past one month he has been developing chest pain any time he exerts himself. He states for the past several days it is beginning much worse, with less exertion. Patient states with mild exertion now he will start with chest heaviness, then aching going down both of his arms. He states if he rests for 5-10 minutes the aching goes away knee feels normal. Currently denies any pain but these lying in bed without exerting himself. States he also feels short of breath with exertion, denies any nausea or diaphoresis.     Past Medical History  Diagnosis Date  . AAA (abdominal aortic aneurysm)   . Hypertension     Patient Active Problem List   Diagnosis Date Noted  . 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 AAA (abdominal aortic aneurysm, ruptured) 08/06/2014    Past Surgical History  Procedure Laterality Date  . Abdominal aortic endovascular stent graft N/A 08/06/2014    Procedure: Abdominal Aortic Endovascular Stent Graft; Embolization and Coiling of Right and Left Internal Iliac Artery;  Surgeon: Pryor Ochoa, MD;  Location: [facility] OR;  Service: Vascular;  Laterality: N/A;  . Abdominal surgery      Current Outpatient Rx  Name  Route  Sig  Dispense  Refill  . amLODipine (NORVASC) 10 MG tablet   Oral   Take 1 tablet (10 mg total) by mouth once. Patient taking differently: Take 10 mg by mouth daily.    30 tablet   0     Allergies Review of patient's allergies indicates no known allergies.  Family History  Problem Relation Age of Onset  . Cancer Mother     Social  History Social History  Substance Use Topics  . Smoking status: Former Smoker -- 0.50 packs/day for 20 years    Types: Cigarettes  . Smokeless tobacco: None  . Alcohol Use: No    Review of Systems Constitutional: Negative for fever. Cardiovascular: Positive for chest heaviness with exertion. Respiratory: Positive for shortness of breath with exertion. Gastrointestinal: Negative for abdominal pain, vomiting and diarrhea. Musculoskeletal: Negative for back pain Neurological: Negative for headache 10-point ROS otherwise negative.  ____________________________________________   PHYSICAL EXAM:  VITAL SIGNS: ED Triage Vitals  Enc Vitals Group     BP 05/14/15 1748 149/87 mmHg     Pulse Rate 05/14/15 1748 61     Resp 05/14/15 1748 20     Temp 05/14/15 1748 98.1 F (36.7 C)     Temp Source 05/14/15 1748 Oral     SpO2 05/14/15 1748 93 %     Weight 05/14/15 1748 300 lb (136.079 kg)     Height 05/14/15 1748  (1.88 m)     Head Cir --      Peak Flow --      Pain Score --      Pain Loc --      Pain Edu? --      Excl. in GC? --     Constitutional: Alert and oriented. Well appearing and in no distress. Eyes: Normal exam ENT   Mouth/Throat: Mucous membranes are  moist. Cardiovascular: Normal rate, regular rhythm. No murmur Respiratory: Normal respiratory effort without tachypnea nor retractions. Breath sounds are clear and equal bilaterally. No wheezes/rales/rhonchi. Gastrointestinal: Soft and nontender. No distention.   Musculoskeletal: Nontender with normal range of motion in all extremities. No lower extremity tenderness or edema. Neurologic:  Normal speech and language. No gross focal neurologic deficits  Skin:  Skin is warm, dry and intact.  Psychiatric: Mood and affect are normal. Speech and behavior are normal.   ____________________________________________    EKG  EKG shows sinus bradycardia at 57 bpm, narrow QRS, mild left axis deviation, intervals within  normal limits. Nonspecific but no concerning ST changes noted.  ____________________________________________    RADIOLOGY  Chest x-ray within normal limits.   INITIAL IMPRESSION / ASSESSMENT AND PLAN / ED COURSE  Pertinent labs & imaging results that were available during my care of the patient were reviewed by me and considered in my medical decision making (see chart for details).  Patient with symptoms consistent with worsening stable angina. Now with an elevated troponin. In reviewing the patient's records he has no history of troponin elevation with similar creatinines. Patient dose aspirin in the emergency department. We will admit for stabilization with chest pain and an elevated troponin. Patient agreeable.  ____________________________________________   FINAL CLINICAL IMPRESSION(S) / ED DIAGNOSES  Chest pain Stable angina   Minna Antis, MD 05/14/15 [phone]
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
## 2015-05-15 ENCOUNTER — Observation Stay
Admit: 2015-05-15 | Discharge: 2015-05-15 | Disposition: A | Discharge status: Home / Self Care | Payer: BLUE CROSS/BLUE SHIELD | Attending: Physician Assistant | Admitting: Physician Assistant

## 2015-05-15 ENCOUNTER — Observation Stay: Payer: BLUE CROSS/BLUE SHIELD

## 2015-05-15 ENCOUNTER — Encounter: Payer: Self-pay | Admitting: Radiology

## 2015-05-15 LAB — TROPONIN I
TROPONIN I: 0.04 ng/mL — AB (ref <0.031)
Troponin I: 0.04 ng/mL — ABNORMAL HIGH (ref <0.031)
Troponin I: 0.05 ng/mL — ABNORMAL HIGH (ref <0.031)

## 2015-05-15 LAB — LIPID PANEL
CHOL/HDL RATIO: 7.4 ratio
CHOLESTEROL: 236 mg/dL — AB (ref 0–200)
HDL: 32 mg/dL — AB (ref >40)
LDL Cholesterol: 174 mg/dL — ABNORMAL HIGH (ref 0–99)
Triglycerides: 149 mg/dL (ref <150)
VLDL: 30 mg/dL (ref 0–40)

## 2015-05-15 MED ORDER — CLOPIDOGREL BISULFATE 75 MG PO TABS
75.0000 mg | ORAL_TABLET | Freq: Every day | ORAL | Status: DC
  Administered 2015-05-15 – 2015-05-17 (×3): 75 mg via ORAL
  Filled 2015-05-15 (×3): qty 1

## 2015-05-15 MED ORDER — ISOSORBIDE MONONITRATE ER 30 MG PO TB24
30.0000 mg | ORAL_TABLET | Freq: Every day | ORAL | Status: DC
  Administered 2015-05-15 – 2015-05-18 (×4): 30 mg via ORAL
  Filled 2015-05-15 (×4): qty 1

## 2015-05-15 MED ORDER — TECHNETIUM TC 99M SESTAMIBI - CARDIOLITE
13.7800 | Freq: Once | INTRAVENOUS | Status: AC | PRN
  Administered 2015-05-15: 13.78 via INTRAVENOUS

## 2015-05-15 MED ORDER — ATORVASTATIN CALCIUM 20 MG PO TABS
80.0000 mg | ORAL_TABLET | Freq: Every day | ORAL | Status: DC
  Administered 2015-05-15 – 2015-05-17 (×3): 80 mg via ORAL
  Filled 2015-05-15 (×4): qty 4
  Filled 2015-05-15: qty 1
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
## 2015-05-15 NOTE — Progress Notes (Signed)
*  PRELIMINARY RESULTS* Echocardiogram 2D Echocardiogram has been performed.  Garrel Ridgel Stills 05/15/2015, 10:01 AM

## 2015-05-15 NOTE — Progress Notes (Signed)
Attending Note: Patient has wall motion abnormalities with anteroseptal Hypokineis, with elevated troponins mildly. He is high risk for CAD, thus will do cath instead of stress test Monday.

## 2015-05-15 NOTE — Progress Notes (Signed)
Patient alert and oriented x4, no complaints at this time. vss at this time. Patient NSR on telemetry. Will continue to assess. Omir Cooprider R [place]  

## 2015-05-16 LAB — PROTIME-INR
INR: 1.07
PROTHROMBIN TIME: 14.1 s (ref 11.4–15.0)

## 2015-05-16 MED ORDER — SODIUM CHLORIDE 0.9 % WEIGHT BASED INFUSION
1.0000 mL/kg/h | INTRAVENOUS | Status: DC
  Administered 2015-05-17 (×2): 1 mL/kg/h via INTRAVENOUS

## 2015-05-16 MED ORDER — SODIUM CHLORIDE 0.9 % IV SOLN: 250.0000 mL | INTRAVENOUS | Status: DC | PRN

## 2015-05-16 MED ORDER — ASPIRIN 81 MG PO CHEW
81.0000 mg | CHEWABLE_TABLET | ORAL | Status: AC
  Administered 2015-05-17: 81 mg via ORAL
  Filled 2015-05-16: qty 1

## 2015-05-16 MED ORDER — SODIUM CHLORIDE 0.9 % WEIGHT BASED INFUSION
3.0000 mL/kg/h | INTRAVENOUS | Status: AC
  Administered 2015-05-17: 3 mL/kg/h via INTRAVENOUS

## 2015-05-16 MED ORDER — SODIUM CHLORIDE 0.9 % IJ SOLN: 3.0000 mL | INTRAMUSCULAR | Status: DC | PRN

## 2015-05-16 MED ORDER — SODIUM CHLORIDE 0.9 % IJ SOLN: 3.0000 mL | Freq: Two times a day (BID) | INTRAMUSCULAR | Status: DC
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
## 2015-05-16 NOTE — Progress Notes (Signed)
   SUBJECTIVE: no CP, but pt states he has only ambulated to bathroom and back.    Filed Vitals:   05/15/15 1034 05/15/15 2039 05/16/15 0532 05/16/15 0746  BP: 173/96 126/82 129/75 125/81  Pulse: 57 80 51 54  Temp:  98.9 F (37.2 C) 98.5 F (36.9 C) 98 F (36.7 C)  TempSrc:  Oral Oral Oral  Resp:  Height:      Weight:      SpO2:  94% 93% 95%    Intake/Output Summary (Last 24 hours) at 05/16/15 1103 Last data filed at 05/16/15 1008  Gross per 24 hour  Intake    960 ml  Output    225 ml  Net    735 ml    LABS: Basic Metabolic Panel:  Recent Labs  40/98/11 1752  NA 137  K 4.1  CL 104  CO2 26  GLUCOSE 115*  BUN 15  CREATININE 1.26*  CALCIUM 9.6   Liver Function Tests: No results for input(s): AST, ALT, ALKPHOS, BILITOT, PROT, ALBUMIN in the last 72 hours. No results for input(s): LIPASE, AMYLASE in the last 72 hours. CBC:  Recent Labs  05/14/15 1752  WBC 5.2  HGB 17.7  HCT 53.8*  MCV 93.1  PLT 215   Cardiac Enzymes:  Recent Labs  05/14/15 2356 05/15/15 0301 05/15/15 0700  TROPONINI 0.05* 0.04* 0.04*   BNP: Invalid input(s): POCBNP D-Dimer: No results for input(s): DDIMER in the last 72 hours. Hemoglobin A1C: No results for input(s): HGBA1C in the last 72 hours. Fasting Lipid Panel:  Recent Labs  05/15/15 0700  CHOL 236*  HDL 32*  LDLCALC 174*  TRIG 149  CHOLHDL 7.4   Thyroid Function Tests: No results for input(s): TSH, T4TOTAL, T3FREE, THYROIDAB in the last 72 hours.  Invalid input(s): FREET3 Anemia Panel: No results for input(s): VITAMINB12, FOLATE, FERRITIN, TIBC, IRON, RETICCTPCT in the last 72 hours.   PHYSICAL EXAM General: Well developed, well nourished, in no acute distress HEENT:  Normocephalic and atramatic Neck:  No JVD.  Lungs: Clear bilaterally to auscultation and percussion. Heart: HRRR . Normal S1 and S2 without gallops or murmurs.  Abdomen: Bowel sounds are positive, abdomen soft and non-tender   Msk:  Back normal, normal gait. Normal strength and tone for age. Extremities: No clubbing, cyanosis or edema.   Neuro: Alert and oriented X 3. Psych:  Good affect, responds appropriately  TELEMETRY: Reviewed telemetry pt in: NSR  ASSESSMENT AND PLAN: unstable angina: plan for cardiac cath tomorrow at 1:30pm as pt likely has CAD. Pt states understanding of plan and agrees.    Patient and plan discussed with supervising provider, Dr. Adrian Blackwater, who agrees with above findings.   Alinda Sierras Margarito Courser [facility]  05/16/2015 11:03 AM

## 2015-05-17 ENCOUNTER — Encounter: Payer: Self-pay | Admitting: *Deleted

## 2015-05-17 ENCOUNTER — Encounter
Admission: EM | Disposition: A | Discharge status: Home / Self Care | Payer: Self-pay | Source: Home / Self Care | Attending: Emergency Medicine

## 2015-05-17 DIAGNOSIS — Z7982 Long term (current) use of aspirin: Secondary | ICD-10-CM | POA: Diagnosis not present

## 2015-05-17 DIAGNOSIS — I25110 Atherosclerotic heart disease of native coronary artery with unstable angina pectoris: Secondary | ICD-10-CM | POA: Diagnosis not present

## 2015-05-17 DIAGNOSIS — I371 Nonrheumatic pulmonary valve insufficiency: Secondary | ICD-10-CM | POA: Diagnosis not present

## 2015-05-17 DIAGNOSIS — R61 Generalized hyperhidrosis: Secondary | ICD-10-CM | POA: Diagnosis not present

## 2015-05-17 DIAGNOSIS — R0602 Shortness of breath: Secondary | ICD-10-CM | POA: Diagnosis not present

## 2015-05-17 DIAGNOSIS — I208 Other forms of angina pectoris: Secondary | ICD-10-CM | POA: Diagnosis present

## 2015-05-17 DIAGNOSIS — Z8249 Family history of ischemic heart disease and other diseases of the circulatory system: Secondary | ICD-10-CM | POA: Diagnosis not present

## 2015-05-17 DIAGNOSIS — I071 Rheumatic tricuspid insufficiency: Secondary | ICD-10-CM | POA: Diagnosis not present

## 2015-05-17 DIAGNOSIS — Z8679 Personal history of other diseases of the circulatory system: Secondary | ICD-10-CM | POA: Diagnosis not present

## 2015-05-17 DIAGNOSIS — R748 Abnormal levels of other serum enzymes: Secondary | ICD-10-CM | POA: Diagnosis not present

## 2015-05-17 DIAGNOSIS — Z809 Family history of malignant neoplasm, unspecified: Secondary | ICD-10-CM | POA: Diagnosis not present

## 2015-05-17 DIAGNOSIS — Z87891 Personal history of nicotine dependence: Secondary | ICD-10-CM | POA: Diagnosis not present

## 2015-05-17 DIAGNOSIS — I259 Chronic ischemic heart disease, unspecified: Secondary | ICD-10-CM | POA: Diagnosis not present

## 2015-05-17 DIAGNOSIS — R079 Chest pain, unspecified: Secondary | ICD-10-CM | POA: Diagnosis not present

## 2015-05-17 DIAGNOSIS — I340 Nonrheumatic mitral (valve) insufficiency: Secondary | ICD-10-CM | POA: Diagnosis not present

## 2015-05-17 DIAGNOSIS — I10 Essential (primary) hypertension: Secondary | ICD-10-CM | POA: Diagnosis not present

## 2015-05-17 DIAGNOSIS — I723 Aneurysm of iliac artery: Secondary | ICD-10-CM | POA: Diagnosis not present

## 2015-05-17 DIAGNOSIS — Z79899 Other long term (current) drug therapy: Secondary | ICD-10-CM | POA: Diagnosis not present

## 2015-05-17 HISTORY — PX: CARDIAC CATHETERIZATION: SHX172

## 2015-05-17 SURGERY — LEFT HEART CATH AND CORONARY ANGIOGRAPHY
Anesthesia: Moderate Sedation

## 2015-05-17 MED ORDER — ACETAMINOPHEN 325 MG PO TABS: 650.0000 mg | ORAL_TABLET | ORAL | Status: DC | PRN

## 2015-05-17 MED ORDER — SODIUM CHLORIDE 0.9 % IJ SOLN: 3.0000 mL | INTRAMUSCULAR | Status: DC | PRN

## 2015-05-17 MED ORDER — TICAGRELOR 90 MG PO TABS
ORAL_TABLET | ORAL | Status: DC | PRN
  Administered 2015-05-17: 180 mg via ORAL

## 2015-05-17 MED ORDER — ACETAMINOPHEN 325 MG PO TABS: 650.0000 mg | ORAL_TABLET | Freq: Four times a day (QID) | ORAL | Status: DC | PRN

## 2015-05-17 MED ORDER — SODIUM CHLORIDE 0.9 % IV SOLN: 250.0000 mL | INTRAVENOUS | Status: DC | PRN

## 2015-05-17 MED ORDER — ASPIRIN 81 MG PO CHEW
CHEWABLE_TABLET | ORAL | Status: DC | PRN
  Administered 2015-05-17: 324 mg via ORAL

## 2015-05-17 MED ORDER — ASPIRIN 81 MG PO CHEW
CHEWABLE_TABLET | ORAL | Status: AC
  Filled 2015-05-17: qty 3

## 2015-05-17 MED ORDER — MIDAZOLAM HCL 2 MG/2ML IJ SOLN
INTRAMUSCULAR | Status: DC | PRN
  Administered 2015-05-17: 1 mg via INTRAVENOUS
  Administered 2015-05-17 (×2): 0.5 mg via INTRAVENOUS

## 2015-05-17 MED ORDER — FENTANYL CITRATE (PF) 100 MCG/2ML IJ SOLN
INTRAMUSCULAR | Status: AC
  Filled 2015-05-17: qty 2

## 2015-05-17 MED ORDER — ONDANSETRON HCL 4 MG/2ML IJ SOLN: 4.0000 mg | Freq: Four times a day (QID) | INTRAMUSCULAR | Status: DC | PRN

## 2015-05-17 MED ORDER — IOHEXOL 300 MG/ML  SOLN
INTRAMUSCULAR | Status: DC | PRN
  Administered 2015-05-17: 30 mL via INTRA_ARTERIAL
  Administered 2015-05-17: 100 mL via INTRA_ARTERIAL
  Administered 2015-05-17: 50 mL via INTRA_ARTERIAL

## 2015-05-17 MED ORDER — ONDANSETRON HCL 4 MG/2ML IJ SOLN
4.0000 mg | Freq: Four times a day (QID) | INTRAMUSCULAR | Status: DC | PRN
Start: 2015-05-17 — End: 2015-05-17

## 2015-05-17 MED ORDER — ASPIRIN 81 MG PO CHEW
81.0000 mg | CHEWABLE_TABLET | Freq: Every day | ORAL | Status: DC
  Administered 2015-05-18: 81 mg via ORAL
  Filled 2015-05-17: qty 1

## 2015-05-17 MED ORDER — ACETAMINOPHEN 650 MG RE SUPP: 650.0000 mg | Freq: Four times a day (QID) | RECTAL | Status: DC | PRN

## 2015-05-17 MED ORDER — FENTANYL CITRATE (PF) 100 MCG/2ML IJ SOLN
INTRAMUSCULAR | Status: DC | PRN
  Administered 2015-05-17: 50 ug via INTRAVENOUS

## 2015-05-17 MED ORDER — SODIUM CHLORIDE 0.9 % WEIGHT BASED INFUSION: 3.0000 mL/kg/h | INTRAVENOUS | Status: AC

## 2015-05-17 MED ORDER — LABETALOL HCL 5 MG/ML IV SOLN
INTRAVENOUS | Status: DC | PRN
  Administered 2015-05-17 (×2): 10 mg via INTRAVENOUS

## 2015-05-17 MED ORDER — IOHEXOL 300 MG/ML  SOLN
INTRAMUSCULAR | Status: DC | PRN
  Administered 2015-05-17: 130 mL via INTRA_ARTERIAL

## 2015-05-17 MED ORDER — BIVALIRUDIN 250 MG IV SOLR
INTRAVENOUS | Status: AC
  Filled 2015-05-17: qty 250

## 2015-05-17 MED ORDER — SODIUM CHLORIDE 0.9 % IJ SOLN: 3.0000 mL | Freq: Two times a day (BID) | INTRAMUSCULAR | Status: DC

## 2015-05-17 MED ORDER — NITROGLYCERIN 5 MG/ML IV SOLN
INTRAVENOUS | Status: AC
  Filled 2015-05-17: qty 10

## 2015-05-17 MED ORDER — BIVALIRUDIN BOLUS VIA INFUSION - CUPID
INTRAVENOUS | Status: DC | PRN
  Administered 2015-05-17: 98.625 mg via INTRAVENOUS

## 2015-05-17 MED ORDER — FENTANYL CITRATE (PF) 100 MCG/2ML IJ SOLN
INTRAMUSCULAR | Status: DC | PRN
  Administered 2015-05-17: 25 ug via INTRAVENOUS
  Administered 2015-05-17: 50 ug via INTRAVENOUS
  Administered 2015-05-17: 25 ug via INTRAVENOUS

## 2015-05-17 MED ORDER — SODIUM CHLORIDE 0.9 % IV SOLN
250.0000 mg | INTRAVENOUS | Status: DC | PRN
  Administered 2015-05-17: 1.75 mg/kg/h via INTRAVENOUS

## 2015-05-17 MED ORDER — HEPARIN (PORCINE) IN NACL 2-0.9 UNIT/ML-% IJ SOLN
INTRAMUSCULAR | Status: AC
  Filled 2015-05-17: qty 1000

## 2015-05-17 MED ORDER — TICAGRELOR 90 MG PO TABS
90.0000 mg | ORAL_TABLET | Freq: Two times a day (BID) | ORAL | Status: DC
  Administered 2015-05-17 – 2015-05-18 (×2): 90 mg via ORAL
  Filled 2015-05-17 (×2): qty 1

## 2015-05-17 MED ORDER — ASPIRIN 81 MG PO CHEW
CHEWABLE_TABLET | ORAL | Status: AC
  Filled 2015-05-17: qty 1

## 2015-05-17 MED ORDER — MIDAZOLAM HCL 2 MG/2ML IJ SOLN
INTRAMUSCULAR | Status: AC
  Filled 2015-05-17: qty 2

## 2015-05-17 MED ORDER — SODIUM CHLORIDE 0.9 % WEIGHT BASED INFUSION: 1.0000 mL/kg/h | INTRAVENOUS | Status: AC

## 2015-05-17 MED ORDER — LABETALOL HCL 5 MG/ML IV SOLN
INTRAVENOUS | Status: AC
  Filled 2015-05-17: qty 4

## 2015-05-17 MED ORDER — TICAGRELOR 90 MG PO TABS
ORAL_TABLET | ORAL | Status: AC
  Filled 2015-05-17: qty 2

## 2015-05-17 SURGICAL SUPPLY — 21 items
BALLN TREK RX 3.0X15 (BALLOONS) ×4
BALLOON TREK RX 3.0X15 (BALLOONS) ×2 IMPLANT
CATH AMP RT 5F (CATHETERS) ×4 IMPLANT
CATH INFINITI 5 FR 3DRC (CATHETERS) ×4 IMPLANT
CATH INFINITI 5FR ANG PIGTAIL (CATHETERS) ×4 IMPLANT
CATH INFINITI 5FR JL4 (CATHETERS) ×4 IMPLANT
CATH INFINITI 5FR JL5 (CATHETERS) ×4 IMPLANT
CATH INFINITI JR4 5F (CATHETERS) ×4 IMPLANT
CATH VISTA GUIDE 6FR XB4.5 (CATHETERS) ×4 IMPLANT
CATH VISTA GUIDE 6FR XBLAD3.5 (CATHETERS) ×4 IMPLANT
DEVICE CLOSURE MYNXGRIP 6/7F (Vascular Products) ×4 IMPLANT
DEVICE INFLAT 30 PLUS (MISCELLANEOUS) ×4 IMPLANT
KIT MANI 3VAL PERCEP (MISCELLANEOUS) ×4 IMPLANT
NEEDLE PERC 18GX7CM (NEEDLE) ×4 IMPLANT
PACK CARDIAC CATH (CUSTOM PROCEDURE TRAY) ×4 IMPLANT
SHEATH AVANTI 6FR X 11CM (SHEATH) ×4 IMPLANT
SHEATH PINNACLE 5F 10CM (SHEATH) ×4 IMPLANT
STENT XIENCE ALPINE RX 3.0X23 (Permanent Stent) ×4 IMPLANT
WIRE EMERALD 3MM-J .035X150CM (WIRE) ×4 IMPLANT
WIRE EMERALD 3MM-J .035X260CM (WIRE) ×4 IMPLANT
WIRE G HI TQ BMW 190 (WIRE) ×4 IMPLANT

## 2015-05-17 NOTE — Progress Notes (Signed)
   SUBJECTIVE: Pt lying comfortably in bed, no SOB or CP.    Filed Vitals:   05/16/15 1306 05/16/15 1948 05/16/15 2024 05/17/15 0433  BP: 121/79 148/100  146/86  Pulse: 74 69  55  Temp: 98.3 F (36.8 C) 98 F (36.7 C)  97.9 F (36.6 C)  TempSrc: Oral Oral  Oral  Resp: Height:      Weight:   134.99 kg (297 lb 9.6 oz)   SpO2: 93% 82%  93%    Intake/Output Summary (Last 24 hours) at 05/17/15 1005 Last data filed at 05/17/15 0655  Gross per 24 hour  Intake 754.69 ml  Output      1 ml  Net 753.69 ml    LABS: Basic Metabolic Panel:  Recent Labs  16/10/96 1752  NA 137  K 4.1  CL 104  CO2 26  GLUCOSE 115*  BUN 15  CREATININE 1.26*  CALCIUM 9.6   Liver Function Tests: No results for input(s): AST, ALT, ALKPHOS, BILITOT, PROT, ALBUMIN in the last 72 hours. No results for input(s): LIPASE, AMYLASE in the last 72 hours. CBC:  Recent Labs  05/14/15 1752  WBC 5.2  HGB 17.7  HCT 53.8*  MCV 93.1  PLT 215   Cardiac Enzymes:  Recent Labs  05/14/15 2356 05/15/15 0301 05/15/15 0700  TROPONINI 0.05* 0.04* 0.04*   BNP: Invalid input(s): POCBNP D-Dimer: No results for input(s): DDIMER in the last 72 hours. Hemoglobin A1C: No results for input(s): HGBA1C in the last 72 hours. Fasting Lipid Panel:  Recent Labs  05/15/15 0700  CHOL 236*  HDL 32*  LDLCALC 174*  TRIG 149  CHOLHDL 7.4   Thyroid Function Tests: No results for input(s): TSH, T4TOTAL, T3FREE, THYROIDAB in the last 72 hours.  Invalid input(s): FREET3 Anemia Panel: No results for input(s): VITAMINB12, FOLATE, FERRITIN, TIBC, IRON, RETICCTPCT in the last 72 hours.   PHYSICAL EXAM General: Well developed, well nourished, in no acute distress HEENT:  Normocephalic and atramatic Neck:  No JVD.  Lungs: Clear bilaterally to auscultation and percussion. Heart: HRRR . Normal S1 and S2 without gallops or murmurs.  Abdomen: Bowel sounds are positive, abdomen soft and non-tender  Msk:   Back normal, normal gait. Normal strength and tone for age. Extremities: No clubbing, cyanosis or edema.   Neuro: Alert and oriented X 3. Psych:  Good affect, responds appropriately  TELEMETRY: Reviewed telemetry pt in NSR  ASSESSMENT AND PLAN: plan for cardiac cath today at 1:30.    Patient and plan discussed with supervising provider, Dr. Adrian Blackwater, who agrees with above findings.   Alinda Sierras Margarito Courser [facility]  05/17/2015 10:05 AM

## 2015-05-17 NOTE — Progress Notes (Signed)
Patient had high grade lesion in mid LAD, requiring PCI/Stenting with DES.

## 2015-05-18 DIAGNOSIS — R079 Chest pain, unspecified: Secondary | ICD-10-CM | POA: Diagnosis not present

## 2015-05-18 MED ORDER — TICAGRELOR 90 MG PO TABS: 90.0000 mg | ORAL_TABLET | Freq: Two times a day (BID) | ORAL | Status: DC

## 2015-05-18 MED ORDER — ASPIRIN 81 MG PO CHEW: 81.0000 mg | CHEWABLE_TABLET | Freq: Every day | ORAL | Status: AC

## 2015-05-18 MED ORDER — METOPROLOL TARTRATE 25 MG PO TABS: 12.5000 mg | ORAL_TABLET | Freq: Two times a day (BID) | ORAL | Status: DC

## 2015-05-18 MED ORDER — ATORVASTATIN CALCIUM 80 MG PO TABS: 80.0000 mg | ORAL_TABLET | Freq: Every day | ORAL | Status: DC

## 2015-05-18 MED ORDER — ISOSORBIDE MONONITRATE ER 30 MG PO TB24: 30.0000 mg | ORAL_TABLET | Freq: Every day | ORAL | Status: DC

## 2015-05-18 NOTE — Progress Notes (Addendum)
Patient discharged, iv and telemetry removed, reviewed discharge instructions and home meds, rx given for Brillinta, other meds called into pharmacy, f/u appt scheduled, vascular dc instructions given, no questions verbalized, escorted by volunteer

## 2015-05-18 NOTE — Care Management (Signed)
Patient for discharge home today on Brilinta.  He has never been on this medication. He does not know if he has drug coverage through his insurance [employer].  "My wife does all the drug store stuff.  Contacted [facility].  Relays that patient does have drug coverage.  Contacted [employer] inquiring about [employer]- copay- and prior auth.  Informed (call reference number [id number]) Tier 4 drug- 50 dollar copay and does not require prior auth.  Provided patient with the 30 day free coupon and copay assist coupon.  Informed patient that [facility] does not have this drug in stock.  Will need to obtain from another pharmacy

## 2015-05-18 NOTE — Progress Notes (Signed)
   SUBJECTIVE: Pt had PCI of LAD yesterday. Feeling well today, ready to go home, no CP or SOB.    Filed Vitals:   05/17/15 1736 05/17/15 2042 05/17/15 2200 05/18/15 0454  BP: 141/94 108/63  143/90  Pulse: 56 55 63 61  Temp: 97.7 F (36.5 C)   98.2 F (36.8 C)  TempSrc: Oral     Resp: Height:      Weight:      SpO2: 97% 96%  95%    Intake/Output Summary (Last 24 hours) at 05/18/15 0837 Last data filed at 05/18/15 0656  Gross per 24 hour  Intake      0 ml  Output    900 ml  Net   -900 ml    LABS: Basic Metabolic Panel: No results for input(s): NA, K, CL, CO2, GLUCOSE, BUN, CREATININE, CALCIUM, MG, PHOS in the last 72 hours. Liver Function Tests: No results for input(s): AST, ALT, ALKPHOS, BILITOT, PROT, ALBUMIN in the last 72 hours. No results for input(s): LIPASE, AMYLASE in the last 72 hours. CBC: No results for input(s): WBC, NEUTROABS, HGB, HCT, MCV, PLT in the last 72 hours. Cardiac Enzymes: No results for input(s): CKTOTAL, CKMB, CKMBINDEX, TROPONINI in the last 72 hours. BNP: Invalid input(s): POCBNP D-Dimer: No results for input(s): DDIMER in the last 72 hours. Hemoglobin A1C: No results for input(s): HGBA1C in the last 72 hours. Fasting Lipid Panel: No results for input(s): CHOL, HDL, LDLCALC, TRIG, CHOLHDL, LDLDIRECT in the last 72 hours. Thyroid Function Tests: No results for input(s): TSH, T4TOTAL, T3FREE, THYROIDAB in the last 72 hours.  Invalid input(s): FREET3 Anemia Panel: No results for input(s): VITAMINB12, FOLATE, FERRITIN, TIBC, IRON, RETICCTPCT in the last 72 hours.   PHYSICAL EXAM General: Well developed, well nourished, in no acute distress HEENT:  Normocephalic and atramatic Neck:  No JVD.  Lungs: Clear bilaterally to auscultation and percussion. Heart: HRRR . Normal S1 and S2 without gallops or murmurs.  Abdomen: Bowel sounds are positive, abdomen soft and non-tender  Msk:  Back normal, normal gait. Normal strength and tone  for age. Extremities: No clubbing, cyanosis or edema. Inguinal region tender, no bruit   Neuro: Alert and oriented X 3. Psych:  Good affect, responds appropriately  TELEMETRY: Reviewed telemetry pt in NSR  ASSESSMENT AND PLAN: Unstable angina: cardiac catheterization yesterday showed high grade lesion, 95% in mid LAD requiring PCI with DES. Proximal LCX 60% stenosed. Pt will need Rx upon discharge for brillinta  BID, lipitor  daily, asa  daily, metoprolol tartrate  BID and imdur  daily. Pt given f/u in office 9/2 at 2:30pm.    Patient and plan discussed with supervising provider, Dr. Adrian Blackwater, who agrees with above findings.   Alinda Sierras Margarito Courser [facility]  05/18/2015 8:37 AM

## 2015-05-20 ENCOUNTER — Encounter: Payer: Self-pay | Admitting: Cardiovascular Disease
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
## 2016-03-17 ENCOUNTER — Encounter: Payer: Self-pay | Admitting: Cardiology

## 2018-08-08 ENCOUNTER — Other Ambulatory Visit: Payer: Self-pay

## 2018-08-08 DIAGNOSIS — N400 Enlarged prostate without lower urinary tract symptoms: Secondary | ICD-10-CM

## 2018-08-09 ENCOUNTER — Ambulatory Visit: Payer: BLUE CROSS/BLUE SHIELD | Admitting: Urology

## 2020-07-07 ENCOUNTER — Ambulatory Visit
Admission: RE | Admit: 2020-07-07 | Payer: BLUE CROSS/BLUE SHIELD | Source: Home / Self Care | Admitting: Internal Medicine

## 2020-07-07 ENCOUNTER — Encounter: Admission: RE | Payer: Self-pay | Source: Home / Self Care

## 2020-07-07 SURGERY — COLONOSCOPY WITH PROPOFOL
Anesthesia: General

## 2021-03-07 DIAGNOSIS — E119 Type 2 diabetes mellitus without complications: Secondary | ICD-10-CM | POA: Diagnosis not present

## 2021-03-14 DIAGNOSIS — I10 Essential (primary) hypertension: Secondary | ICD-10-CM | POA: Diagnosis not present

## 2021-03-14 DIAGNOSIS — E119 Type 2 diabetes mellitus without complications: Secondary | ICD-10-CM | POA: Diagnosis not present

## 2021-03-14 DIAGNOSIS — E785 Hyperlipidemia, unspecified: Secondary | ICD-10-CM | POA: Diagnosis not present

## 2021-03-14 DIAGNOSIS — N189 Chronic kidney disease, unspecified: Secondary | ICD-10-CM | POA: Diagnosis not present

## 2021-03-14 DIAGNOSIS — J301 Allergic rhinitis due to pollen: Secondary | ICD-10-CM | POA: Diagnosis not present

## 2021-03-14 DIAGNOSIS — I2510 Atherosclerotic heart disease of native coronary artery without angina pectoris: Secondary | ICD-10-CM | POA: Diagnosis not present

## 2021-06-08 DIAGNOSIS — E119 Type 2 diabetes mellitus without complications: Secondary | ICD-10-CM | POA: Diagnosis not present

## 2021-06-20 DIAGNOSIS — J301 Allergic rhinitis due to pollen: Secondary | ICD-10-CM | POA: Diagnosis not present

## 2021-06-20 DIAGNOSIS — F17200 Nicotine dependence, unspecified, uncomplicated: Secondary | ICD-10-CM | POA: Diagnosis not present

## 2021-06-20 DIAGNOSIS — I2510 Atherosclerotic heart disease of native coronary artery without angina pectoris: Secondary | ICD-10-CM | POA: Diagnosis not present

## 2021-06-20 DIAGNOSIS — Z23 Encounter for immunization: Secondary | ICD-10-CM | POA: Diagnosis not present

## 2021-06-20 DIAGNOSIS — N189 Chronic kidney disease, unspecified: Secondary | ICD-10-CM | POA: Diagnosis not present

## 2021-06-20 DIAGNOSIS — I10 Essential (primary) hypertension: Secondary | ICD-10-CM | POA: Diagnosis not present

## 2021-06-20 DIAGNOSIS — E119 Type 2 diabetes mellitus without complications: Secondary | ICD-10-CM | POA: Diagnosis not present

## 2021-08-19 DIAGNOSIS — E119 Type 2 diabetes mellitus without complications: Secondary | ICD-10-CM | POA: Diagnosis not present

## 2021-08-19 DIAGNOSIS — I10 Essential (primary) hypertension: Secondary | ICD-10-CM | POA: Diagnosis not present

## 2021-08-29 DIAGNOSIS — Z0001 Encounter for general adult medical examination with abnormal findings: Secondary | ICD-10-CM | POA: Diagnosis not present

## 2021-08-29 DIAGNOSIS — I2510 Atherosclerotic heart disease of native coronary artery without angina pectoris: Secondary | ICD-10-CM | POA: Diagnosis not present

## 2021-08-29 DIAGNOSIS — I10 Essential (primary) hypertension: Secondary | ICD-10-CM | POA: Diagnosis not present

## 2021-08-29 DIAGNOSIS — N189 Chronic kidney disease, unspecified: Secondary | ICD-10-CM | POA: Diagnosis not present

## 2021-08-29 DIAGNOSIS — E119 Type 2 diabetes mellitus without complications: Secondary | ICD-10-CM | POA: Diagnosis not present

## 2021-08-29 DIAGNOSIS — J301 Allergic rhinitis due to pollen: Secondary | ICD-10-CM | POA: Diagnosis not present

## 2021-08-29 DIAGNOSIS — E785 Hyperlipidemia, unspecified: Secondary | ICD-10-CM | POA: Diagnosis not present

## 2021-08-29 DIAGNOSIS — G3184 Mild cognitive impairment, so stated: Secondary | ICD-10-CM | POA: Diagnosis not present

## 2021-08-29 DIAGNOSIS — F17210 Nicotine dependence, cigarettes, uncomplicated: Secondary | ICD-10-CM | POA: Diagnosis not present

## 2021-08-29 DIAGNOSIS — E1340 Other specified diabetes mellitus with diabetic neuropathy, unspecified: Secondary | ICD-10-CM | POA: Diagnosis not present

## 2021-11-30 DIAGNOSIS — E119 Type 2 diabetes mellitus without complications: Secondary | ICD-10-CM | POA: Diagnosis not present

## 2021-12-06 DIAGNOSIS — E785 Hyperlipidemia, unspecified: Secondary | ICD-10-CM | POA: Diagnosis not present

## 2021-12-06 DIAGNOSIS — J301 Allergic rhinitis due to pollen: Secondary | ICD-10-CM | POA: Diagnosis not present

## 2021-12-06 DIAGNOSIS — N189 Chronic kidney disease, unspecified: Secondary | ICD-10-CM | POA: Diagnosis not present

## 2021-12-06 DIAGNOSIS — E119 Type 2 diabetes mellitus without complications: Secondary | ICD-10-CM | POA: Diagnosis not present

## 2021-12-06 DIAGNOSIS — I2510 Atherosclerotic heart disease of native coronary artery without angina pectoris: Secondary | ICD-10-CM | POA: Diagnosis not present

## 2021-12-06 DIAGNOSIS — I10 Essential (primary) hypertension: Secondary | ICD-10-CM | POA: Diagnosis not present

## 2022-01-16 DIAGNOSIS — H34211 Partial retinal artery occlusion, right eye: Secondary | ICD-10-CM | POA: Diagnosis not present

## 2022-03-08 DIAGNOSIS — N189 Chronic kidney disease, unspecified: Secondary | ICD-10-CM | POA: Diagnosis not present

## 2022-03-08 DIAGNOSIS — E1340 Other specified diabetes mellitus with diabetic neuropathy, unspecified: Secondary | ICD-10-CM | POA: Diagnosis not present

## 2022-03-29 DIAGNOSIS — I10 Essential (primary) hypertension: Secondary | ICD-10-CM | POA: Diagnosis not present

## 2022-03-29 DIAGNOSIS — F17200 Nicotine dependence, unspecified, uncomplicated: Secondary | ICD-10-CM | POA: Diagnosis not present

## 2022-03-29 DIAGNOSIS — E785 Hyperlipidemia, unspecified: Secondary | ICD-10-CM | POA: Diagnosis not present

## 2022-03-29 DIAGNOSIS — J301 Allergic rhinitis due to pollen: Secondary | ICD-10-CM | POA: Diagnosis not present

## 2022-03-29 DIAGNOSIS — I2510 Atherosclerotic heart disease of native coronary artery without angina pectoris: Secondary | ICD-10-CM | POA: Diagnosis not present

## 2022-03-29 DIAGNOSIS — N189 Chronic kidney disease, unspecified: Secondary | ICD-10-CM | POA: Diagnosis not present

## 2022-04-10 DIAGNOSIS — H348312 Tributary (branch) retinal vein occlusion, right eye, stable: Secondary | ICD-10-CM | POA: Diagnosis not present

## 2022-04-10 DIAGNOSIS — H31012 Macula scars of posterior pole (postinflammatory) (post-traumatic), left eye: Secondary | ICD-10-CM | POA: Diagnosis not present

## 2022-04-10 DIAGNOSIS — H3561 Retinal hemorrhage, right eye: Secondary | ICD-10-CM | POA: Diagnosis not present

## 2022-04-10 DIAGNOSIS — H35033 Hypertensive retinopathy, bilateral: Secondary | ICD-10-CM | POA: Diagnosis not present

## 2022-04-10 DIAGNOSIS — H43813 Vitreous degeneration, bilateral: Secondary | ICD-10-CM | POA: Diagnosis not present

## 2022-06-28 DIAGNOSIS — N189 Chronic kidney disease, unspecified: Secondary | ICD-10-CM | POA: Diagnosis not present

## 2022-06-28 DIAGNOSIS — E785 Hyperlipidemia, unspecified: Secondary | ICD-10-CM | POA: Diagnosis not present

## 2022-06-28 DIAGNOSIS — E119 Type 2 diabetes mellitus without complications: Secondary | ICD-10-CM | POA: Diagnosis not present

## 2022-07-05 DIAGNOSIS — I2510 Atherosclerotic heart disease of native coronary artery without angina pectoris: Secondary | ICD-10-CM | POA: Diagnosis not present

## 2022-07-05 DIAGNOSIS — N189 Chronic kidney disease, unspecified: Secondary | ICD-10-CM | POA: Diagnosis not present

## 2022-07-05 DIAGNOSIS — E1340 Other specified diabetes mellitus with diabetic neuropathy, unspecified: Secondary | ICD-10-CM | POA: Diagnosis not present

## 2022-07-05 DIAGNOSIS — J301 Allergic rhinitis due to pollen: Secondary | ICD-10-CM | POA: Diagnosis not present

## 2022-07-05 DIAGNOSIS — E785 Hyperlipidemia, unspecified: Secondary | ICD-10-CM | POA: Diagnosis not present

## 2022-07-05 DIAGNOSIS — Z23 Encounter for immunization: Secondary | ICD-10-CM | POA: Diagnosis not present

## 2022-07-05 DIAGNOSIS — F17200 Nicotine dependence, unspecified, uncomplicated: Secondary | ICD-10-CM | POA: Diagnosis not present

## 2022-07-05 DIAGNOSIS — I10 Essential (primary) hypertension: Secondary | ICD-10-CM | POA: Diagnosis not present

## 2022-07-05 DIAGNOSIS — E119 Type 2 diabetes mellitus without complications: Secondary | ICD-10-CM | POA: Diagnosis not present

## 2022-08-30 DIAGNOSIS — E119 Type 2 diabetes mellitus without complications: Secondary | ICD-10-CM | POA: Diagnosis not present

## 2022-08-30 DIAGNOSIS — I10 Essential (primary) hypertension: Secondary | ICD-10-CM | POA: Diagnosis not present

## 2022-08-30 DIAGNOSIS — E782 Mixed hyperlipidemia: Secondary | ICD-10-CM | POA: Diagnosis not present

## 2022-08-30 DIAGNOSIS — R079 Chest pain, unspecified: Secondary | ICD-10-CM | POA: Diagnosis not present

## 2022-08-30 DIAGNOSIS — R0602 Shortness of breath: Secondary | ICD-10-CM | POA: Diagnosis not present

## 2022-08-30 DIAGNOSIS — Z9861 Coronary angioplasty status: Secondary | ICD-10-CM | POA: Diagnosis not present

## 2022-08-30 DIAGNOSIS — I340 Nonrheumatic mitral (valve) insufficiency: Secondary | ICD-10-CM | POA: Diagnosis not present

## 2022-08-30 DIAGNOSIS — I2510 Atherosclerotic heart disease of native coronary artery without angina pectoris: Secondary | ICD-10-CM | POA: Diagnosis not present

## 2022-08-30 DIAGNOSIS — F17200 Nicotine dependence, unspecified, uncomplicated: Secondary | ICD-10-CM | POA: Diagnosis not present

## 2022-09-14 DIAGNOSIS — R079 Chest pain, unspecified: Secondary | ICD-10-CM | POA: Diagnosis not present

## 2022-09-14 DIAGNOSIS — I361 Nonrheumatic tricuspid (valve) insufficiency: Secondary | ICD-10-CM | POA: Diagnosis not present

## 2022-09-22 DIAGNOSIS — E119 Type 2 diabetes mellitus without complications: Secondary | ICD-10-CM | POA: Diagnosis not present

## 2022-09-22 DIAGNOSIS — I340 Nonrheumatic mitral (valve) insufficiency: Secondary | ICD-10-CM | POA: Diagnosis not present

## 2022-09-22 DIAGNOSIS — I10 Essential (primary) hypertension: Secondary | ICD-10-CM | POA: Diagnosis not present

## 2022-09-22 DIAGNOSIS — F17200 Nicotine dependence, unspecified, uncomplicated: Secondary | ICD-10-CM | POA: Diagnosis not present

## 2022-09-22 DIAGNOSIS — I2510 Atherosclerotic heart disease of native coronary artery without angina pectoris: Secondary | ICD-10-CM | POA: Diagnosis not present

## 2022-09-22 DIAGNOSIS — E782 Mixed hyperlipidemia: Secondary | ICD-10-CM | POA: Diagnosis not present

## 2022-09-22 DIAGNOSIS — Z9861 Coronary angioplasty status: Secondary | ICD-10-CM | POA: Diagnosis not present

## 2022-09-22 DIAGNOSIS — I719 Aortic aneurysm of unspecified site, without rupture: Secondary | ICD-10-CM | POA: Diagnosis not present

## 2022-10-04 DIAGNOSIS — N189 Chronic kidney disease, unspecified: Secondary | ICD-10-CM | POA: Diagnosis not present

## 2022-10-04 DIAGNOSIS — J301 Allergic rhinitis due to pollen: Secondary | ICD-10-CM | POA: Diagnosis not present

## 2022-10-04 DIAGNOSIS — E119 Type 2 diabetes mellitus without complications: Secondary | ICD-10-CM | POA: Diagnosis not present

## 2022-10-04 DIAGNOSIS — I2510 Atherosclerotic heart disease of native coronary artery without angina pectoris: Secondary | ICD-10-CM | POA: Diagnosis not present

## 2022-10-04 DIAGNOSIS — I10 Essential (primary) hypertension: Secondary | ICD-10-CM | POA: Diagnosis not present

## 2022-10-23 ENCOUNTER — Other Ambulatory Visit: Payer: Self-pay | Admitting: Internal Medicine

## 2022-11-27 ENCOUNTER — Other Ambulatory Visit: Payer: Self-pay | Admitting: Cardiovascular Disease

## 2022-11-27 ENCOUNTER — Other Ambulatory Visit: Payer: Self-pay | Admitting: Internal Medicine

## 2022-11-27 DIAGNOSIS — I2510 Atherosclerotic heart disease of native coronary artery without angina pectoris: Secondary | ICD-10-CM

## 2022-11-27 DIAGNOSIS — I10 Essential (primary) hypertension: Secondary | ICD-10-CM

## 2022-11-27 DIAGNOSIS — R079 Chest pain, unspecified: Secondary | ICD-10-CM

## 2022-12-21 ENCOUNTER — Other Ambulatory Visit: Payer: Self-pay | Admitting: Internal Medicine

## 2023-01-03 ENCOUNTER — Ambulatory Visit: Payer: Medicare Other | Admitting: Internal Medicine

## 2023-01-12 ENCOUNTER — Other Ambulatory Visit: Payer: Self-pay | Admitting: Internal Medicine

## 2023-01-16 ENCOUNTER — Other Ambulatory Visit: Payer: Medicare Other

## 2023-01-16 ENCOUNTER — Other Ambulatory Visit: Payer: Self-pay | Admitting: Internal Medicine

## 2023-01-16 DIAGNOSIS — I10 Essential (primary) hypertension: Secondary | ICD-10-CM | POA: Diagnosis not present

## 2023-01-16 DIAGNOSIS — E119 Type 2 diabetes mellitus without complications: Secondary | ICD-10-CM | POA: Diagnosis not present

## 2023-01-17 LAB — COMPREHENSIVE METABOLIC PANEL
ALT: 20 IU/L (ref 0–44)
AST: 26 IU/L (ref 0–40)
Albumin/Globulin Ratio: 1.7 (ref 1.2–2.2)
Albumin: 4.6 g/dL (ref 3.8–4.8)
Alkaline Phosphatase: 90 IU/L (ref 44–121)
BUN/Creatinine Ratio: 11 (ref 10–24)
BUN: 15 mg/dL (ref 8–27)
Bilirubin Total: 0.6 mg/dL (ref 0.0–1.2)
CO2: 19 mmol/L — ABNORMAL LOW (ref 20–29)
Calcium: 10.1 mg/dL (ref 8.6–10.2)
Chloride: 102 mmol/L (ref 96–106)
Creatinine, Ser: 1.37 mg/dL — ABNORMAL HIGH (ref 0.76–1.27)
Globulin, Total: 2.7 g/dL (ref 1.5–4.5)
Glucose: 91 mg/dL (ref 70–99)
Potassium: 5.2 mmol/L (ref 3.5–5.2)
Sodium: 139 mmol/L (ref 134–144)
Total Protein: 7.3 g/dL (ref 6.0–8.5)
eGFR: 55 mL/min/{1.73_m2} — ABNORMAL LOW (ref >59)

## 2023-01-17 LAB — CBC WITH DIFFERENTIAL/PLATELET
Basophils Absolute: 0.1 10*3/uL (ref 0.0–0.2)
Basos: 1 %
EOS (ABSOLUTE): 0.1 10*3/uL (ref 0.0–0.4)
Eos: 1 %
Hematocrit: 50.1 % (ref 37.5–51.0)
Hemoglobin: 16.6 g/dL (ref 13.0–17.7)
Immature Grans (Abs): 0 10*3/uL (ref 0.0–0.1)
Immature Granulocytes: 0 %
Lymphocytes Absolute: 1.7 10*3/uL (ref 0.7–3.1)
Lymphs: 35 %
MCH: 30.7 pg (ref 26.6–33.0)
MCHC: 33.1 g/dL (ref 31.5–35.7)
MCV: 93 fL (ref 79–97)
Monocytes Absolute: 0.4 10*3/uL (ref 0.1–0.9)
Monocytes: 7 %
Neutrophils Absolute: 2.7 10*3/uL (ref 1.4–7.0)
Neutrophils: 56 %
Platelets: 261 10*3/uL (ref 150–450)
RBC: 5.41 x10E6/uL (ref 4.14–5.80)
RDW: 13.8 % (ref 11.6–15.4)
WBC: 4.9 10*3/uL (ref 3.4–10.8)

## 2023-01-17 LAB — HGB A1C W/O EAG: Hgb A1c MFr Bld: 6.1 % — ABNORMAL HIGH (ref 4.8–5.6)

## 2023-01-17 LAB — LIPID PANEL W/O CHOL/HDL RATIO
Cholesterol, Total: 112 mg/dL (ref 100–199)
HDL: 44 mg/dL (ref >39)
LDL Chol Calc (NIH): 54 mg/dL (ref 0–99)
Triglycerides: 68 mg/dL (ref 0–149)
VLDL Cholesterol Cal: 14 mg/dL (ref 5–40)

## 2023-01-17 LAB — PSA, SERUM (SERIAL MONITOR): Prostate Specific Ag, Serum: 0.3 ng/mL (ref 0.0–4.0)

## 2023-01-24 ENCOUNTER — Encounter: Payer: Self-pay | Admitting: Internal Medicine

## 2023-01-24 ENCOUNTER — Ambulatory Visit (INDEPENDENT_AMBULATORY_CARE_PROVIDER_SITE_OTHER): Payer: Medicare Other | Admitting: Internal Medicine

## 2023-01-24 VITALS — BP 120/80 | HR 57 | Ht 74.0 in | Wt 284.6 lb

## 2023-01-24 DIAGNOSIS — Z0001 Encounter for general adult medical examination with abnormal findings: Secondary | ICD-10-CM

## 2023-01-24 DIAGNOSIS — I10 Essential (primary) hypertension: Secondary | ICD-10-CM

## 2023-01-24 DIAGNOSIS — E7800 Pure hypercholesterolemia, unspecified: Secondary | ICD-10-CM | POA: Diagnosis not present

## 2023-01-24 DIAGNOSIS — E119 Type 2 diabetes mellitus without complications: Secondary | ICD-10-CM | POA: Diagnosis not present

## 2023-01-24 DIAGNOSIS — Z1331 Encounter for screening for depression: Secondary | ICD-10-CM | POA: Diagnosis not present

## 2023-01-24 LAB — GLUCOSE, POCT (MANUAL RESULT ENTRY): POC Glucose: 104 mg/dl — AB (ref 70–99)

## 2023-01-24 MED ORDER — OLMESARTAN MEDOXOMIL 20 MG PO TABS: 20.0000 mg | ORAL_TABLET | Freq: Every morning | ORAL | 1 refills | Status: DC

## 2023-01-24 NOTE — Progress Notes (Signed)
Established Patient Office Visit  Subjective:  Patient ID: Curtis Ballard, male    DOB: 1951/04/16  Age: 72 y.o. MRN: [id number]  Chief Complaint  Patient presents with   Annual Exam    AWV    No new complaints, here AWV and refer to scanned documents.  No new complaints, here for lab review and medication refills. Labs reviewed and notable for well controlled diabetes, A1c at target, lipids at target with unremarkable cmp. CBC and PSA unremarkable. Denies any hypoglycemic episodes and home bg readings have been at target.     No other concerns at this time.   Past Medical History:  Diagnosis Date   AAA (abdominal aortic aneurysm) (HCC)    AAA (abdominal aortic aneurysm) (HCC)    Hypertension     Past Surgical History:  Procedure Laterality Date   ABDOMINAL AORTIC ENDOVASCULAR STENT GRAFT N/A 08/06/2014   Procedure: Abdominal Aortic Endovascular Stent Graft; Embolization and Coiling of Right and Left Internal Iliac Artery;  Surgeon: Pryor Ochoa, MD;  Location: [facility] OR;  Service: Vascular;  Laterality: N/A;   ABDOMINAL SURGERY     CARDIAC CATHETERIZATION Left 05/17/2015   Procedure: Left Heart Cath and Coronary Angiography;  Surgeon: Laurier Nancy, MD;  Location: [facility] INVASIVE CV LAB;  Service: Cardiovascular;  Laterality: Left;   CARDIAC CATHETERIZATION N/A 05/17/2015   Procedure: Coronary Stent Intervention;  Surgeon: Alwyn Pea, MD;  Location: [facility] INVASIVE CV LAB;  Service: Cardiovascular;  Laterality: N/A;    Social History   Socioeconomic History   Marital status: Married    Spouse name: Not on file   Number of children: Not on file   Years of education: Not on file   Highest education level: Not on file  Occupational History   Not on file  Tobacco Use   Smoking status: Former    Packs/day: 0.50    Years: 20.00    Additional pack years: 0.00    Total pack years: 10.00    Types: Cigarettes   Smokeless tobacco: Not on file  Substance and Sexual  Activity   Alcohol use: Yes    Alcohol/week: 2.0 standard drinks of alcohol    Types: 2 Cans of beer per week   Drug use: No    Types: Marijuana   Sexual activity: Yes    Partners: Male  Other Topics Concern   Not on file  Social History Narrative   Not on file   Social Determinants of Health   Financial Resource Strain: Not on file  Food Insecurity: Not on file  Transportation Needs: Not on file  Physical Activity: Not on file  Stress: Not on file  Social Connections: Not on file  Intimate Partner Violence: Not on file    Family History  Problem Relation Age of Onset   Cancer Mother    Heart disease Father     No Known Allergies  Review of Systems  Constitutional: Negative.   HENT: Negative.    Eyes: Negative.   Respiratory: Negative.    Cardiovascular: Negative.   Gastrointestinal: Negative.   Genitourinary:        Nocturia  Musculoskeletal:  Positive for joint pain.       Right ankle pain  Skin: Negative.   Neurological: Negative.   Endo/Heme/Allergies: Negative.  Negative for environmental allergies.  Psychiatric/Behavioral:  The patient does not have insomnia.        Objective:   BP 120/80   Pulse (!) 57  Ht 6\' 2"  (1.88 m)   Wt 284 lb 9.6 oz (129.1 kg)   SpO2 94%   BMI 36.54 kg/m   Vitals:   01/24/23 0851  BP: 120/80  Pulse: (!) 57  Height: 6\' 2"  (1.88 m)  Weight: 284 lb 9.6 oz (129.1 kg)  SpO2: 94%  BMI (Calculated): 36.53    Physical Exam Vitals reviewed.  Constitutional:      General: He is not in acute distress.    Appearance: He is obese.  HENT:     Head: Normocephalic.     Left Ear: There is no impacted cerumen.     Nose: Nose normal.     Mouth/Throat:     Mouth: Mucous membranes are moist.     Pharynx: No posterior oropharyngeal erythema.  Eyes:     Extraocular Movements: Extraocular movements intact.     Pupils: Pupils are equal, round, and reactive to light.  Neck:     Vascular: No carotid bruit.  Cardiovascular:      Rate and Rhythm: Regular rhythm.     Chest Wall: PMI is not displaced.     Pulses: Normal pulses.     Heart sounds: Normal heart sounds. No murmur heard. Pulmonary:     Effort: Pulmonary effort is normal.     Breath sounds: Normal air entry. No rhonchi or rales.  Abdominal:     General: Abdomen is flat. Bowel sounds are normal. There is no distension.     Palpations: Abdomen is soft. There is no hepatomegaly, splenomegaly or mass.     Tenderness: There is no abdominal tenderness.  Musculoskeletal:        General: Normal range of motion.     Cervical back: Normal range of motion and neck supple.     Right lower leg: No edema.     Left lower leg: No edema.     Comments: Right ankle brace  Lymphadenopathy:     Cervical: No cervical adenopathy.  Skin:    General: Skin is warm and dry.     Comments: Healed burns both forearms  Neurological:     General: No focal deficit present.     Mental Status: He is alert and oriented to person, place, and time.     Cranial Nerves: No cranial nerve deficit.     Motor: No weakness.  Psychiatric:        Mood and Affect: Mood normal.        Behavior: Behavior normal.      Results for orders placed or performed in visit on 01/24/23  POCT Glucose (CBG)  Result Value Ref Range   POC Glucose 104 (A) 70 - 99 mg/dl    Recent Results (from the past 2160 hour(Miliyah Luper))  CBC with Differential/Platelet     Status: None   Collection Time: 01/16/23  9:25 AM  Result Value Ref Range   WBC 4.9 3.4 - 10.8 x10E3/uL   RBC 5.41 4.14 - 5.80 x10E6/uL   Hemoglobin 16.6 13.0 - 17.7 g/dL   Hematocrit 16.1 09.6 - 51.0 %   MCV 93 79 - 97 fL   MCH 30.7 26.6 - 33.0 pg   MCHC 33.1 31.5 - 35.7 g/dL   RDW 04.5 40.9 - 81.1 %   Platelets 261 150 - 450 x10E3/uL   Neutrophils 56 Not Estab. %   Lymphs 35 Not Estab. %   Monocytes 7 Not Estab. %   Eos 1 Not Estab. %   Basos 1 Not Estab. %  Neutrophils Absolute 2.7 1.4 - 7.0 x10E3/uL   Lymphocytes Absolute 1.7 0.7 -  3.1 x10E3/uL   Monocytes Absolute 0.4 0.1 - 0.9 x10E3/uL   EOS (ABSOLUTE) 0.1 0.0 - 0.4 x10E3/uL   Basophils Absolute 0.1 0.0 - 0.2 x10E3/uL   Immature Granulocytes 0 Not Estab. %   Immature Grans (Abs) 0.0 0.0 - 0.1 x10E3/uL  Comprehensive metabolic panel     Status: Abnormal   Collection Time: 01/16/23  9:25 AM  Result Value Ref Range   Glucose 91 70 - 99 mg/dL   BUN 15 8 - 27 mg/dL   Creatinine, Ser 6.04 (H) 0.76 - 1.27 mg/dL   eGFR 55 (L) >54 UJ/WJX/9.14   BUN/Creatinine Ratio 11 10 - 24   Sodium 139 134 - 144 mmol/L   Potassium 5.2 3.5 - 5.2 mmol/L   Chloride 102 96 - 106 mmol/L   CO2 19 (L) 20 - 29 mmol/L   Calcium 10.1 8.6 - 10.2 mg/dL   Total Protein 7.3 6.0 - 8.5 g/dL   Albumin 4.6 3.8 - 4.8 g/dL   Globulin, Total 2.7 1.5 - 4.5 g/dL   Albumin/Globulin Ratio 1.7 1.2 - 2.2   Bilirubin Total 0.6 0.0 - 1.2 mg/dL   Alkaline Phosphatase 90 44 - 121 IU/L   AST 26 0 - 40 IU/L   ALT 20 0 - 44 IU/L  Lipid Panel w/o Chol/HDL Ratio     Status: None   Collection Time: 01/16/23  9:25 AM  Result Value Ref Range   Cholesterol, Total 112 100 - 199 mg/dL   Triglycerides 68 0 - 149 mg/dL   HDL 44 >78 mg/dL   VLDL Cholesterol Cal 14 5 - 40 mg/dL   LDL Chol Calc (NIH) 54 0 - 99 mg/dL  PSA, SERUM (SERIAL MONITOR)     Status: None   Collection Time: 01/16/23  9:25 AM  Result Value Ref Range   Prostate Specific Ag, Serum 0.3 0.0 - 4.0 ng/mL    Comment: Roche ECLIA methodology. According to the American Urological Association, Serum PSA should decrease and remain at undetectable levels after radical prostatectomy. The AUA defines biochemical recurrence as an initial PSA value 0.2 ng/mL or greater followed by a subsequent confirmatory PSA value 0.2 ng/mL or greater. Values obtained with different assay methods or kits cannot be used interchangeably. Results cannot be interpreted as absolute evidence of the presence or absence of malignant disease.   Hgb A1c w/o eAG     Status:  Abnormal   Collection Time: 01/16/23  9:25 AM  Result Value Ref Range   Hgb A1c MFr Bld 6.1 (H) 4.8 - 5.6 %    Comment:          Prediabetes: 5.7 - 6.4          Diabetes: >6.4          Glycemic control for adults with diabetes: <7.0   POCT Glucose (CBG)     Status: Abnormal   Collection Time: 01/24/23  8:59 AM  Result Value Ref Range   POC Glucose 104 (A) 70 - 99 mg/dl      Assessment & Plan:   Problem List Items Addressed This Visit   None Visit Diagnoses     Type 2 diabetes mellitus without complication, without long-term current use of insulin (HCC)    -  Primary   Relevant Orders   POCT Glucose (CBG) (Completed)       No follow-ups on file.   Total time  spent: 35 minutes  Luna Fuse, MD  01/24/2023

## 2023-02-25 ENCOUNTER — Other Ambulatory Visit: Payer: Self-pay | Admitting: Cardiovascular Disease

## 2023-02-25 DIAGNOSIS — I10 Essential (primary) hypertension: Secondary | ICD-10-CM

## 2023-02-25 DIAGNOSIS — I2510 Atherosclerotic heart disease of native coronary artery without angina pectoris: Secondary | ICD-10-CM

## 2023-02-25 DIAGNOSIS — R079 Chest pain, unspecified: Secondary | ICD-10-CM

## 2023-02-26 ENCOUNTER — Other Ambulatory Visit: Payer: Self-pay | Admitting: Internal Medicine

## 2023-03-23 ENCOUNTER — Encounter: Payer: Self-pay | Admitting: Cardiovascular Disease

## 2023-03-23 ENCOUNTER — Ambulatory Visit: Payer: Medicare Other | Admitting: Cardiovascular Disease

## 2023-03-23 VITALS — BP 134/90 | HR 63 | Ht 75.0 in | Wt 284.6 lb

## 2023-03-23 DIAGNOSIS — R0789 Other chest pain: Secondary | ICD-10-CM | POA: Diagnosis not present

## 2023-03-23 DIAGNOSIS — I723 Aneurysm of iliac artery: Secondary | ICD-10-CM | POA: Diagnosis not present

## 2023-03-23 DIAGNOSIS — I25118 Atherosclerotic heart disease of native coronary artery with other forms of angina pectoris: Secondary | ICD-10-CM

## 2023-03-23 DIAGNOSIS — R0602 Shortness of breath: Secondary | ICD-10-CM

## 2023-03-23 DIAGNOSIS — I7131 Pararenal abdominal aortic aneurysm, ruptured: Secondary | ICD-10-CM | POA: Diagnosis not present

## 2023-03-23 DIAGNOSIS — E119 Type 2 diabetes mellitus without complications: Secondary | ICD-10-CM | POA: Diagnosis not present

## 2023-03-23 NOTE — Progress Notes (Signed)
Cardiology Office Note   Date:  03/23/2023   ID:  Curtis Ballard, DOB 11-18-1950, MRN [id number]  PCP:  Sherron Monday, MD  Cardiologist:  Adrian Blackwater, MD      History of Present Illness: Curtis Ballard is a 72 y.o. male who presents for  Chief Complaint  Patient presents with   Follow-up    6 month follow up    Feels Short of breath  Shortness of Breath This is a chronic problem. The current episode started more than 1 year ago. The problem has been unchanged. Associated symptoms include chest pain.  Chest Pain  This is a chronic problem. The current episode started more than 1 year ago. The problem has been unchanged. Associated symptoms include shortness of breath.      Past Medical History:  Diagnosis Date   AAA (abdominal aortic aneurysm) (HCC)    AAA (abdominal aortic aneurysm) (HCC)    Hypertension      Past Surgical History:  Procedure Laterality Date   ABDOMINAL AORTIC ENDOVASCULAR STENT GRAFT N/A 08/06/2014   Procedure: Abdominal Aortic Endovascular Stent Graft; Embolization and Coiling of Right and Left Internal Iliac Artery;  Surgeon: Pryor Ochoa, MD;  Location: [facility] OR;  Service: Vascular;  Laterality: N/A;   ABDOMINAL SURGERY     CARDIAC CATHETERIZATION Left 05/17/2015   Procedure: Left Heart Cath and Coronary Angiography;  Surgeon: Laurier Nancy, MD;  Location: [facility] INVASIVE CV LAB;  Service: Cardiovascular;  Laterality: Left;   CARDIAC CATHETERIZATION N/A 05/17/2015   Procedure: Coronary Stent Intervention;  Surgeon: Alwyn Pea, MD;  Location: [facility] INVASIVE CV LAB;  Service: Cardiovascular;  Laterality: N/A;    REASON FOR VISIT  Visit for: Echocardiogram/R06.02  Sex:   Male          wt=289    lbs.  BP=135/80  Height= 74   inches.        TESTS  Imaging: Echocardiogram:  An echocardiogram in (2-d) mode was performed and in Doppler mode with color flow velocity mapping was performed. The aortic valve cusps are abnormal 2.0  cm, flow  velocity 1.25  m/s, and systolic calculated mean flow gradient 4   mmHg. Mitral valve diastolic peak flow velocity E .923   m/s and E/A ratio 1.4. Aortic root diameter 3.9  cm. The LVOT internal diameter 4.0  cm and flow velocity was abnormal 1.11  m/s. LV systolic dimension 2.61   cm, diastolic 3.74  cm, posterior wall thickness 1.48   cm, fractional shortening 30.2 %, and EF 58.4 %. IVS thickness 1.77   cm. LA dimension 4.4 cm. Mitral Valve has Mild Regurgitation. Pulmonic Valve has Trace Regurgitation. Tricuspid Valve has Mild Regurgitation.     ASSESSMENT  Technically adequate study.  Normal left ventricular systolic function.  Mild left ventricular hypertrophy with GRADE 2 (Psuedonormalization) diastolic dysfunction.  Normal right ventricular systolic function.  Normal right ventricular diastolic function.  Normal left ventricular wall motion.  Normal right ventricular wall motion.  Trace pulmonary regurgitation.  Mild tricuspid regurgitation.  Normal pulmonary artery pressure.  Mild mitral regurgitation.  No pericardial effusion.  Mildly dilated Left and Right atrium  Mild LVH.     THERAPY   Referring physician: Delman Cheadle A. Tejan-Sie  Curtis Ballard  Sonographer:      Adrian Blackwater MD  Electronically signed by: Adrian Blackwater     Date: 09/14/2022 13:16  Current Outpatient Medications  Medication Sig Dispense Refill   amLODipine (NORVASC) 10 MG tablet TAKE 1 TABLET BY MOUTH EVERY MORNING 90 tablet 0   aspirin 81 MG chewable tablet Chew 1 tablet (81 mg total) by mouth daily. 30 tablet 0   atorvastatin (LIPITOR) 80 MG tablet TAKE 1 TABLET BY MOUTH EVERY EVENING 90 tablet 0   clopidogrel (PLAVIX) 75 MG tablet TAKE 1 TABLET BY MOUTH EVERY DAY 90 tablet 0   dutasteride (AVODART) 0.5 MG capsule TAKE 1 CAPSULE BY MOUTH EVERY MORNING 90 capsule 0   ezetimibe (ZETIA) 10 MG tablet TAKE 1 TABLET BY MOUTH EVERY DAY  90 tablet 1   hydrALAZINE (APRESOLINE) 50 MG tablet TAKE 1 TABLET BY MOUTH THREE TIMES DAILY 270 tablet 0   isosorbide mononitrate (IMDUR) 30 MG 24 hr tablet TAKE 1 TABLET BY MOUTH EVERY DAY 90 tablet 0   metFORMIN (GLUCOPHAGE-XR) 500 MG 24 hr tablet TAKE 1 TABLET BY MOUTH EVERY MORNING 90 tablet 0   metoprolol tartrate (LOPRESSOR) 25 MG tablet TAKE 1 TABLET BY MOUTH TWICE DAILY 180 tablet 1   olmesartan (BENICAR) 20 MG tablet Take 1 tablet (20 mg total) by mouth every morning. 100 tablet 1   oxybutynin (DITROPAN) 5 MG tablet TAKE 1 TABLET BY MOUTH TWICE DAILY 180 tablet 1   No current facility-administered medications for this visit.    Allergies:   Patient has no known allergies.    Social History:   reports that he has quit smoking. His smoking use included cigarettes. He has a 10.00 pack-year smoking history. He does not have any smokeless tobacco history on file. He reports current alcohol use of about 2.0 standard drinks of alcohol per week. He reports that he does not use drugs.   Family History:  family history includes Cancer in his mother; Heart disease in his father.    ROS:     Review of Systems  Constitutional: Negative.   HENT: Negative.    Eyes: Negative.   Respiratory:  Positive for shortness of breath.   Cardiovascular:  Positive for chest pain.  Gastrointestinal: Negative.   Genitourinary: Negative.   Musculoskeletal: Negative.   Skin: Negative.   Neurological: Negative.   Endo/Heme/Allergies: Negative.   Psychiatric/Behavioral: Negative.    All other systems reviewed and are negative.     All other systems are reviewed and negative.    PHYSICAL EXAM: VS:  BP (!) 134/90   Pulse 63   Ht 6\' 3"  (1.905 m)   Wt 284 lb 9.6 oz (129.1 kg)   SpO2 96%   BMI 35.57 kg/m  , BMI Body mass index is 35.57 kg/m. Last weight:  Wt Readings from Last 3 Encounters:  03/23/23 284 lb 9.6 oz (129.1 kg)  01/24/23 284 lb 9.6 oz (129.1 kg)  05/17/15 290 lb (131.5 kg)      Physical Exam Vitals reviewed.  Constitutional:      Appearance: Normal appearance. He is normal weight.  HENT:     Head: Normocephalic.     Nose: Nose normal.     Mouth/Throat:     Mouth: Mucous membranes are moist.  Eyes:     Pupils: Pupils are equal, round, and reactive to light.  Cardiovascular:     Rate and Rhythm: Normal rate and regular rhythm.     Pulses: Normal pulses.     Heart sounds: Normal heart sounds.  Pulmonary:     Effort: Pulmonary effort is normal.  Abdominal:  General: Abdomen is flat. Bowel sounds are normal.  Musculoskeletal:        General: Normal range of motion.     Cervical back: Normal range of motion.  Skin:    General: Skin is warm.  Neurological:     General: No focal deficit present.     Mental Status: He is alert.  Psychiatric:        Mood and Affect: Mood normal.       EKG:   Recent Labs: 01/16/2023: ALT 20; BUN 15; Creatinine, Ser 1.37; Hemoglobin 16.6; Platelets 261; Potassium 5.2; Sodium 139    REASON FOR VISIT  Visit for: Echocardiogram/R06.02  Sex:   Male          wt=289    lbs.  BP=135/80  Height= 74   inches.        TESTS  Imaging: Echocardiogram:  An echocardiogram in (2-d) mode was performed and in Doppler mode with color flow velocity mapping was performed. The aortic valve cusps are abnormal 2.0  cm, flow velocity 1.25  m/s, and systolic calculated mean flow gradient 4   mmHg. Mitral valve diastolic peak flow velocity E .923   m/s and E/A ratio 1.4. Aortic root diameter 3.9  cm. The LVOT internal diameter 4.0  cm and flow velocity was abnormal 1.11  m/s. LV systolic dimension 2.61   cm, diastolic 3.74  cm, posterior wall thickness 1.48   cm, fractional shortening 30.2 %, and EF 58.4 %. IVS thickness 1.77   cm. LA dimension 4.4 cm. Mitral Valve has Mild Regurgitation. Pulmonic Valve has Trace Regurgitation. Tricuspid Valve has Mild Regurgitation.     ASSESSMENT  Technically adequate study.  Normal  left ventricular systolic function.  Mild left ventricular hypertrophy with GRADE 2 (Psuedonormalization) diastolic dysfunction.  Normal right ventricular systolic function.  Normal right ventricular diastolic function.  Normal left ventricular wall motion.  Normal right ventricular wall motion.  Trace pulmonary regurgitation.  Mild tricuspid regurgitation.  Normal pulmonary artery pressure.  Mild mitral regurgitation.  No pericardial effusion.  Mildly dilated Left and Right atrium  Mild LVH.     THERAPY   Referring physician: Delman Cheadle A. Tejan-Sie  Curtis Ballard  Sonographer:      Adrian Blackwater MD  Electronically signed by: Adrian Blackwater     Date: 09/14/2022 13:16 Lipid Panel    Component Value Date/Time   CHOL 112 01/16/2023 0925   TRIG 68 01/16/2023 0925   HDL 44 01/16/2023 0925   CHOLHDL 7.4 05/15/2015 0700   VLDL 30 05/15/2015 0700   LDLCALC 54 01/16/2023 0925      Other studies Reviewed: Additional studies/ records that were reviewed today include:  Review of the above records demonstrates:       No data to display            ASSESSMENT AND PLAN:    ICD-10-CM   1. Ruptured pararenal abdominal aortic aneurysm (AAA) (HCC)  I71.31 MYOCARDIAL PERFUSION IMAGING    2. Bilateral iliac artery aneurysm (HCC)  I72.3 MYOCARDIAL PERFUSION IMAGING    3. Type 2 diabetes mellitus without complication, without long-term current use of insulin (HCC)  E11.9 MYOCARDIAL PERFUSION IMAGING    4. Other chest pain  R07.89 MYOCARDIAL PERFUSION IMAGING    5. SOB (shortness of breath)  R06.02 MYOCARDIAL PERFUSION IMAGING    6. Coronary artery disease of native artery of native heart with stable  angina pectoris (HCC)  I25.118 MYOCARDIAL PERFUSION IMAGING   Had PCI LAD 2016, with no stress test, advise stress test       Problem List Items Addressed This Visit       Cardiovascular and Mediastinum   AAA  (abdominal aortic aneurysm, ruptured) (HCC) - Primary   Relevant Orders   MYOCARDIAL PERFUSION IMAGING   Bilateral iliac artery aneurysm (HCC)   Relevant Orders   MYOCARDIAL PERFUSION IMAGING     Endocrine   Type 2 diabetes mellitus without complication, without long-term current use of insulin (HCC)   Relevant Orders   MYOCARDIAL PERFUSION IMAGING     Other   Chest pain   Relevant Orders   MYOCARDIAL PERFUSION IMAGING   Other Visit Diagnoses     SOB (shortness of breath)       Relevant Orders   MYOCARDIAL PERFUSION IMAGING   Coronary artery disease of native artery of native heart with stable angina pectoris (HCC)       Had PCI LAD 2016, with no stress test, advise stress test   Relevant Orders   MYOCARDIAL PERFUSION IMAGING          Disposition:   Return in about 1 week (around 03/30/2023) for stress test and f/u.    Total time spent: 30 minutes  Signed,  Adrian Blackwater, MD  03/23/2023 9:54 AM    [facility]

## 2023-03-24 ENCOUNTER — Other Ambulatory Visit: Payer: Self-pay | Admitting: Nurse Practitioner

## 2023-03-25 ENCOUNTER — Other Ambulatory Visit: Payer: Self-pay | Admitting: Nurse Practitioner

## 2023-04-02 ENCOUNTER — Ambulatory Visit: Payer: Medicare Other | Admitting: Cardiovascular Disease

## 2023-04-14 ENCOUNTER — Other Ambulatory Visit: Payer: Self-pay | Admitting: Internal Medicine

## 2023-04-18 ENCOUNTER — Other Ambulatory Visit: Payer: Medicare Other

## 2023-04-18 ENCOUNTER — Other Ambulatory Visit: Payer: Self-pay

## 2023-04-18 DIAGNOSIS — E7800 Pure hypercholesterolemia, unspecified: Secondary | ICD-10-CM

## 2023-04-18 DIAGNOSIS — E119 Type 2 diabetes mellitus without complications: Secondary | ICD-10-CM | POA: Diagnosis not present

## 2023-04-25 ENCOUNTER — Encounter: Payer: Self-pay | Admitting: Internal Medicine

## 2023-04-25 ENCOUNTER — Ambulatory Visit (INDEPENDENT_AMBULATORY_CARE_PROVIDER_SITE_OTHER): Payer: Medicare Other | Admitting: Internal Medicine

## 2023-04-25 VITALS — BP 120/84 | Ht 75.0 in | Wt 283.4 lb

## 2023-04-25 DIAGNOSIS — N3941 Urge incontinence: Secondary | ICD-10-CM

## 2023-04-25 DIAGNOSIS — E7800 Pure hypercholesterolemia, unspecified: Secondary | ICD-10-CM

## 2023-04-25 DIAGNOSIS — I10 Essential (primary) hypertension: Secondary | ICD-10-CM | POA: Diagnosis not present

## 2023-04-25 DIAGNOSIS — E119 Type 2 diabetes mellitus without complications: Secondary | ICD-10-CM

## 2023-04-25 LAB — POCT CBG (FASTING - GLUCOSE)-MANUAL ENTRY: Glucose Fasting, POC: 92 mg/dL (ref 70–99)

## 2023-04-25 MED ORDER — OXYBUTYNIN CHLORIDE 5 MG PO TABS
5.0000 mg | ORAL_TABLET | Freq: Two times a day (BID) | ORAL | 1 refills | Status: DC
Start: 2023-04-25 — End: 2023-11-14

## 2023-04-25 NOTE — Progress Notes (Signed)
Established Patient Office Visit  Subjective:  Patient ID: Curtis Ballard, male    DOB: 28-Nov-1950  Age: 72 y.o. MRN: [id number]  Chief Complaint  Patient presents with   Follow-up    3 mo with lab results.    No new complaints, here for lab review and medication refills. Labs reviewed and notable for well controlled diabetes, A1c at target, lipids at target with unremarkable cmp. Denies any hypoglycemic episodes and home bg readings have been at target. Reports black toenail on his right little toe to which he denies trauma.     No other concerns at this time.   Past Medical History:  Diagnosis Date   AAA (abdominal aortic aneurysm) (HCC)    AAA (abdominal aortic aneurysm) (HCC)    Hypertension     Past Surgical History:  Procedure Laterality Date   ABDOMINAL AORTIC ENDOVASCULAR STENT GRAFT N/A 08/06/2014   Procedure: Abdominal Aortic Endovascular Stent Graft; Embolization and Coiling of Right and Left Internal Iliac Artery;  Surgeon: Pryor Ochoa, MD;  Location: [facility] OR;  Service: Vascular;  Laterality: N/A;   ABDOMINAL SURGERY     CARDIAC CATHETERIZATION Left 05/17/2015   Procedure: Left Heart Cath and Coronary Angiography;  Surgeon: Laurier Nancy, MD;  Location: [facility] INVASIVE CV LAB;  Service: Cardiovascular;  Laterality: Left;   CARDIAC CATHETERIZATION N/A 05/17/2015   Procedure: Coronary Stent Intervention;  Surgeon: Alwyn Pea, MD;  Location: [facility] INVASIVE CV LAB;  Service: Cardiovascular;  Laterality: N/A;    Social History   Socioeconomic History   Marital status: Married    Spouse name: Not on file   Number of children: Not on file   Years of education: Not on file   Highest education level: Not on file  Occupational History   Not on file  Tobacco Use   Smoking status: Former    Current packs/day: 0.50    Average packs/day: 0.5 packs/day for 20.0 years (10.0 ttl pk-yrs)    Types: Cigarettes   Smokeless tobacco: Not on file  Substance and Sexual  Activity   Alcohol use: Yes    Alcohol/week: 2.0 standard drinks of alcohol    Types: 2 Cans of beer per week   Drug use: No    Types: Marijuana   Sexual activity: Yes    Partners: Male  Other Topics Concern   Not on file  Social History Narrative   Not on file   Social Determinants of Health   Financial Resource Strain: Not on file  Food Insecurity: Not on file  Transportation Needs: Not on file  Physical Activity: Not on file  Stress: Not on file  Social Connections: Not on file  Intimate Partner Violence: Not on file    Family History  Problem Relation Age of Onset   Cancer Mother    Heart disease Father     No Known Allergies  Review of Systems  Constitutional: Negative.   HENT: Negative.    Eyes: Negative.   Respiratory: Negative.    Cardiovascular: Negative.   Gastrointestinal: Negative.   Genitourinary:        Nocturia  Musculoskeletal:  Positive for joint pain.       Right ankle pain  Skin: Negative.   Neurological: Negative.   Endo/Heme/Allergies: Negative.  Negative for environmental allergies.  Psychiatric/Behavioral:  The patient does not have insomnia.        Objective:   BP 120/84   Ht 6\' 3"  (1.905 m)   Wt 283 **Note De-Identified via Obfucation** lb 6.4 oz (128.5 kg)   BMI 35.42 kg/m   Vital:   04/25/23 0927 04/25/23 0958  BP: (!) 120/95 120/84  Height: 6\' 3"  (1.905 m)   Weight: 283 lb 6.4 oz (128.5 kg)   BMI (Calculated): 35.42     Phyical Exam Vital reviewed.  Contitutional:      General: He i not in acute ditre.    Appearance: He i obee.  HENT:     Head: Normocephalic.     Left Ear: There i no impacted cerumen.     Noe: Noe normal.     Mouth/Throat:     Mouth: Mucou membrane are moit.     Pharynx: No poterior oropharyngeal erythema.  Eye:     Extraocular Movement: Extraocular movement intact.     Pupil: Pupil are equal, round, and reactive to light.  Neck:     Vacular: No carotid bruit.  Cardiovacular:     Rate and Rhythm:  Regular rhythm.     Chet Wall: PMI i not diplaced.     Pule: Normal pule.     Heart ound: Normal heart ound. No murmur heard. Pulmonary:     Effort: Pulmonary effort i normal.     Breath ound: Normal air entry. No rhonchi or rale.  Abdominal:     General: Abdomen i flat. Bowel ound are normal. There i no ditenion.     Palpation: Abdomen i oft. There i no hepatomegaly, plenomegaly or ma.     Tenderne: There i no abdominal tenderne.  Muculokeletal:        General: Normal range of motion.     Cervical back: Normal range of motion and neck upple.     Right lower leg: No edema.     Left lower leg: No edema.     Comment: Right ankle brace  Lymphadenopathy:     Cervical: No cervical adenopathy.  Skin:    General: Skin i warm and dry.     Comment: Healed burn both forearm  Neurological:     General: No focal deficit preent.     Mental Statu: He i alert and oriented to peron, place, and time.     Cranial Nerve: No cranial nerve deficit.     Motor: No weakne.  Pychiatric:        Mood and Affect: Mood normal.        Behavior: Behavior normal.      Reult for order placed or performed in viit on 04/25/23  POCT CBG (Fating - Glucoe)  Reult Value Ref Range   Glucoe Fating, POC 92 70 - 99 mg/dL    Recent Reult (from the pat 2160 hour())  Hepatic function panel     Statu: None   Collection Time: 04/18/23  9:12 AM  Reult Value Ref Range   Total Protein 7.4 6.0 - 8.5 g/dL   Albumin 4.6 3.8 - 4.8 g/dL   Bilirubin Total 0.5 0.0 - 1.2 mg/dL   Bilirubin, Direct 5.62 0.00 - 0.40 mg/dL   Alkaline [place] 82 44 - 121 IU/L   AST 26 0 - 40 IU/L   ALT 21 0 - 44 IU/L  CK     Statu: None   Collection Time: 04/18/23  9:23 AM  Reult Value Ref Range   Total CK 284 41 - 331 U/L  Hemoglobin A1c     Statu: Abnormal   Collection Time: 04/18/23  9:23 AM  Reult Value Ref Range   Hgb A1c MFr  Bld 6.2 (H) 4.8 - 5.6 %    Comment:           Prediabetes: 5.7 - 6.4          Diabetes: >6.4          Glycemic control for adults with diabetes: <7.0    Est. average glucose Bld gHb Est-mCnc 131 mg/dL  Lipid panel     Status: None   Collection Time: 04/18/23  9:23 AM  Result Value Ref Range   Cholesterol, Total 104 100 - 199 mg/dL   Triglycerides 51 0 - 149 mg/dL   HDL 41 >32 mg/dL   VLDL Cholesterol Cal 12 5 - 40 mg/dL   LDL Chol Calc (NIH) 51 0 - 99 mg/dL   Chol/HDL Ratio 2.5 0.0 - 5.0 ratio    Comment:                                   T. Chol/HDL Ratio                                             Men  Women                               1/2 Avg.Risk  3.4    3.3                                   Avg.Risk  5.0    4.4                                2X Avg.Risk  9.6    7.1                                3X Avg.Risk 23.4   11.0   POCT CBG (Fasting - Glucose)     Status: None   Collection Time: 04/25/23  9:31 AM  Result Value Ref Range   Glucose Fasting, POC 92 70 - 99 mg/dL      Assessment & Plan:  As per problem list  Problem List Items Addressed This Visit       Endocrine   Type 2 diabetes mellitus without complication, without long-term current use of insulin (HCC) - Primary   Relevant Orders   POCT CBG (Fasting - Glucose) (Completed)   Other Visit Diagnoses     Urge incontinence       Relevant Medications   oxybutynin (DITROPAN) 5 MG tablet       Return in about 3 months (around 07/26/2023) for fu with labs prior.   Total time spent: 20 minutes  Luna Fuse, MD  04/25/2023   This document may have been prepared by [facility] Voice Recognition software and as such may include unintentional dictation errors.

## 2023-05-21 ENCOUNTER — Other Ambulatory Visit: Payer: Self-pay | Admitting: Internal Medicine

## 2023-05-26 ENCOUNTER — Other Ambulatory Visit: Payer: Self-pay | Admitting: Cardiovascular Disease

## 2023-05-26 DIAGNOSIS — I2510 Atherosclerotic heart disease of native coronary artery without angina pectoris: Secondary | ICD-10-CM

## 2023-05-26 DIAGNOSIS — I10 Essential (primary) hypertension: Secondary | ICD-10-CM

## 2023-06-06 ENCOUNTER — Other Ambulatory Visit: Payer: Self-pay | Admitting: Cardiovascular Disease

## 2023-06-06 DIAGNOSIS — R079 Chest pain, unspecified: Secondary | ICD-10-CM

## 2023-06-22 ENCOUNTER — Other Ambulatory Visit: Payer: Self-pay | Admitting: Cardiovascular Disease

## 2023-06-22 DIAGNOSIS — R079 Chest pain, unspecified: Secondary | ICD-10-CM

## 2023-06-25 ENCOUNTER — Other Ambulatory Visit: Payer: Self-pay | Admitting: Internal Medicine

## 2023-06-29 ENCOUNTER — Other Ambulatory Visit: Payer: Self-pay

## 2023-07-02 ENCOUNTER — Other Ambulatory Visit: Payer: Self-pay

## 2023-07-03 ENCOUNTER — Other Ambulatory Visit: Payer: Self-pay

## 2023-07-03 MED ORDER — ATORVASTATIN CALCIUM 80 MG PO TABS: 80.0000 mg | ORAL_TABLET | Freq: Every evening | ORAL | 0 refills | Status: DC

## 2023-07-13 ENCOUNTER — Other Ambulatory Visit: Payer: Self-pay

## 2023-07-18 ENCOUNTER — Other Ambulatory Visit: Payer: Self-pay

## 2023-07-18 ENCOUNTER — Other Ambulatory Visit: Payer: Medicare Other

## 2023-07-18 DIAGNOSIS — E7800 Pure hypercholesterolemia, unspecified: Secondary | ICD-10-CM

## 2023-07-18 DIAGNOSIS — E119 Type 2 diabetes mellitus without complications: Secondary | ICD-10-CM | POA: Diagnosis not present

## 2023-07-19 LAB — COMPREHENSIVE METABOLIC PANEL
ALT: 29 [IU]/L (ref 0–44)
AST: 27 [IU]/L (ref 0–40)
Albumin: 4.5 g/dL (ref 3.8–4.8)
Alkaline Phosphatase: 89 [IU]/L (ref 44–121)
BUN/Creatinine Ratio: 8 — ABNORMAL LOW (ref 10–24)
BUN: 12 mg/dL (ref 8–27)
Bilirubin Total: 0.8 mg/dL (ref 0.0–1.2)
CO2: 23 mmol/L (ref 20–29)
Calcium: 10.2 mg/dL (ref 8.6–10.2)
Chloride: 103 mmol/L (ref 96–106)
Creatinine, Ser: 1.48 mg/dL — ABNORMAL HIGH (ref 0.76–1.27)
Globulin, Total: 3.1 g/dL (ref 1.5–4.5)
Glucose: 101 mg/dL — ABNORMAL HIGH (ref 70–99)
Potassium: 5.2 mmol/L (ref 3.5–5.2)
Sodium: 142 mmol/L (ref 134–144)
Total Protein: 7.6 g/dL (ref 6.0–8.5)
eGFR: 50 mL/min/{1.73_m2} — ABNORMAL LOW (ref >59)

## 2023-07-19 LAB — LIPID PANEL
Chol/HDL Ratio: 2.7 ratio (ref 0.0–5.0)
Cholesterol, Total: 133 mg/dL (ref 100–199)
HDL: 50 mg/dL (ref >39)
LDL Chol Calc (NIH): 68 mg/dL (ref 0–99)
Triglycerides: 72 mg/dL (ref 0–149)
VLDL Cholesterol Cal: 15 mg/dL (ref 5–40)

## 2023-07-19 LAB — HEMOGLOBIN A1C
Est. average glucose Bld gHb Est-mCnc: 137 mg/dL
Hgb A1c MFr Bld: 6.4 % — ABNORMAL HIGH (ref 4.8–5.6)

## 2023-07-19 LAB — CK: Total CK: 319 U/L (ref 41–331)

## 2023-07-25 ENCOUNTER — Ambulatory Visit (INDEPENDENT_AMBULATORY_CARE_PROVIDER_SITE_OTHER): Payer: Medicare Other | Admitting: Internal Medicine

## 2023-07-25 ENCOUNTER — Other Ambulatory Visit: Payer: Self-pay

## 2023-07-25 ENCOUNTER — Encounter: Payer: Self-pay | Admitting: Internal Medicine

## 2023-07-25 VITALS — BP 121/90 | HR 73 | Ht 74.0 in | Wt 283.6 lb

## 2023-07-25 DIAGNOSIS — E119 Type 2 diabetes mellitus without complications: Secondary | ICD-10-CM | POA: Diagnosis not present

## 2023-07-25 DIAGNOSIS — Z23 Encounter for immunization: Secondary | ICD-10-CM

## 2023-07-25 DIAGNOSIS — Z0130 Encounter for examination of blood pressure without abnormal findings: Secondary | ICD-10-CM

## 2023-07-25 LAB — POC CREATINE & ALBUMIN,URINE
Albumin/Creatinine Ratio, Urine, POC: <30
Creatinine, POC: 50 mg/dL
Microalbumin Ur, POC: 10 mg/L

## 2023-07-25 LAB — POCT CBG (FASTING - GLUCOSE)-MANUAL ENTRY: Glucose Fasting, POC: 93 mg/dL (ref 70–99)

## 2023-07-25 MED ORDER — METFORMIN HCL ER 500 MG PO TB24: 500.0000 mg | ORAL_TABLET | Freq: Every morning | ORAL | 0 refills | Status: DC

## 2023-07-25 MED ORDER — EMPAGLIFLOZIN 10 MG PO TABS: 10.0000 mg | ORAL_TABLET | Freq: Every day | ORAL | 0 refills | Status: AC

## 2023-07-25 NOTE — Progress Notes (Signed)
Established Patient Office Visit  Subjective:  Patient ID: Curtis Ballard, male    DOB: 10/28/1950  Age: 72 y.o. MRN: [id number]  Chief Complaint  Patient presents with   Follow-up    No new complaints, here for lab review and medication refills. Labs reviewed and notable for well controlled diabetes, A1c at target, lipids at target with cmp notable for stable CKD 3. Denies any hypoglycemic episodes and home bg readings have been at target.    No other concerns at this time.   Past Medical History:  Diagnosis Date   AAA (abdominal aortic aneurysm) (HCC)    AAA (abdominal aortic aneurysm) (HCC)    Hypertension     Past Surgical History:  Procedure Laterality Date   ABDOMINAL AORTIC ENDOVASCULAR STENT GRAFT N/A 08/06/2014   Procedure: Abdominal Aortic Endovascular Stent Graft; Embolization and Coiling of Right and Left Internal Iliac Artery;  Surgeon: Pryor Ochoa, MD;  Location: [facility] OR;  Service: Vascular;  Laterality: N/A;   ABDOMINAL SURGERY     CARDIAC CATHETERIZATION Left 05/17/2015   Procedure: Left Heart Cath and Coronary Angiography;  Surgeon: Laurier Nancy, MD;  Location: [facility] INVASIVE CV LAB;  Service: Cardiovascular;  Laterality: Left;   CARDIAC CATHETERIZATION N/A 05/17/2015   Procedure: Coronary Stent Intervention;  Surgeon: Alwyn Pea, MD;  Location: [facility] INVASIVE CV LAB;  Service: Cardiovascular;  Laterality: N/A;    Social History   Socioeconomic History   Marital status: Married    Spouse name: Not on file   Number of children: Not on file   Years of education: Not on file   Highest education level: Not on file  Occupational History   Not on file  Tobacco Use   Smoking status: Former    Current packs/day: 0.50    Average packs/day: 0.5 packs/day for 20.0 years (10.0 ttl pk-yrs)    Types: Cigarettes   Smokeless tobacco: Not on file  Substance and Sexual Activity   Alcohol use: Yes    Alcohol/week: 2.0 standard drinks of alcohol    Types: 2  Cans of beer per week   Drug use: No    Types: Marijuana   Sexual activity: Yes    Partners: Male  Other Topics Concern   Not on file  Social History Narrative   Not on file   Social Determinants of Health   Financial Resource Strain: Not on file  Food Insecurity: Not on file  Transportation Needs: Not on file  Physical Activity: Not on file  Stress: Not on file  Social Connections: Not on file  Intimate Partner Violence: Not on file    Family History  Problem Relation Age of Onset   Cancer Mother    Heart disease Father     No Known Allergies  Review of Systems  Constitutional: Negative.   HENT: Negative.    Eyes: Negative.   Respiratory: Negative.    Cardiovascular: Negative.   Gastrointestinal: Negative.   Genitourinary:        Nocturia  Musculoskeletal:  Positive for joint pain.       Right ankle pain  Skin: Negative.   Neurological: Negative.   Endo/Heme/Allergies: Negative.  Negative for environmental allergies.  Psychiatric/Behavioral:  The patient does not have insomnia.        Objective:   BP (!) 121/90   Pulse 73   Ht 6\' 2"  (1.88 m)   Wt 283 lb 9.6 oz (128.6 kg)   SpO2 95%   BMI 36.41  kg/m   Vitals:   07/25/23 0911  BP: (!) 121/90  Pulse: 73  Height: 6\' 2"  (1.88 m)  Weight: 283 lb 9.6 oz (128.6 kg)  SpO2: 95%  BMI (Calculated): 36.4    Physical Exam Vitals reviewed.  Constitutional:      General: He is not in acute distress.    Appearance: He is obese.  HENT:     Head: Normocephalic.     Left Ear: There is no impacted cerumen.     Nose: Nose normal.     Mouth/Throat:     Mouth: Mucous membranes are moist.     Pharynx: No posterior oropharyngeal erythema.  Eyes:     Extraocular Movements: Extraocular movements intact.     Pupils: Pupils are equal, round, and reactive to light.  Neck:     Vascular: No carotid bruit.  Cardiovascular:     Rate and Rhythm: Regular rhythm.     Chest Wall: PMI is not displaced.     Pulses:  Normal pulses.     Heart sounds: Normal heart sounds. No murmur heard. Pulmonary:     Effort: Pulmonary effort is normal.     Breath sounds: Normal air entry. No rhonchi or rales.  Abdominal:     General: Abdomen is flat. Bowel sounds are normal. There is no distension.     Palpations: Abdomen is soft. There is no hepatomegaly, splenomegaly or mass.     Tenderness: There is no abdominal tenderness.  Musculoskeletal:        General: Normal range of motion.     Cervical back: Normal range of motion and neck supple.     Right lower leg: No edema.     Left lower leg: No edema.     Comments: Right ankle brace  Lymphadenopathy:     Cervical: No cervical adenopathy.  Skin:    General: Skin is warm and dry.     Comments: Healed burns both forearms  Neurological:     General: No focal deficit present.     Mental Status: He is alert and oriented to person, place, and time.     Cranial Nerves: No cranial nerve deficit.     Motor: No weakness.  Psychiatric:        Mood and Affect: Mood normal.        Behavior: Behavior normal.      Results for orders placed or performed in visit on 07/25/23  POCT CBG (Fasting - Glucose)  Result Value Ref Range   Glucose Fasting, POC 93 70 - 99 mg/dL  POC CREATINE & ALBUMIN,URINE  Result Value Ref Range   Microalbumin Ur, POC 10 mg/L   Creatinine, POC 50 mg/dL   Albumin/Creatinine Ratio, Urine, POC <30     Recent Results (from the past 2160 hour(Rachell Druckenmiller))  Comprehensive metabolic panel     Status: Abnormal   Collection Time: 07/18/23  9:55 AM  Result Value Ref Range   Glucose 101 (H) 70 - 99 mg/dL   BUN 12 8 - 27 mg/dL   Creatinine, Ser 4.54 (H) 0.76 - 1.27 mg/dL   eGFR 50 (L) >09 WJ/XBJ/4.78   BUN/Creatinine Ratio 8 (L) 10 - 24   Sodium 142 134 - 144 mmol/L   Potassium 5.2 3.5 - 5.2 mmol/L   Chloride 103 96 - 106 mmol/L   CO2 23 20 - 29 mmol/L   Calcium 10.2 8.6 - 10.2 mg/dL   Total Protein 7.6 6.0 - 8.5 g/dL   Albumin  4.5 3.8 - 4.8 g/dL    Globulin, Total 3.1 1.5 - 4.5 g/dL   Bilirubin Total 0.8 0.0 - 1.2 mg/dL   Alkaline Phosphatase 89 44 - 121 IU/L   AST 27 0 - 40 IU/L   ALT 29 0 - 44 IU/L  CK     Status: None   Collection Time: 07/18/23  9:55 AM  Result Value Ref Range   Total CK 319 41 - 331 U/L  Hemoglobin A1c     Status: Abnormal   Collection Time: 07/18/23  9:55 AM  Result Value Ref Range   Hgb A1c MFr Bld 6.4 (H) 4.8 - 5.6 %    Comment:          Prediabetes: 5.7 - 6.4          Diabetes: >6.4          Glycemic control for adults with diabetes: <7.0    Est. average glucose Bld gHb Est-mCnc 137 mg/dL  Lipid panel     Status: None   Collection Time: 07/18/23  9:55 AM  Result Value Ref Range   Cholesterol, Total 133 100 - 199 mg/dL   Triglycerides 72 0 - 149 mg/dL   HDL 50 >40 mg/dL   VLDL Cholesterol Cal 15 5 - 40 mg/dL   LDL Chol Calc (NIH) 68 0 - 99 mg/dL   Chol/HDL Ratio 2.7 0.0 - 5.0 ratio    Comment:                                   T. Chol/HDL Ratio                                             Men  Women                               1/2 Avg.Risk  3.4    3.3                                   Avg.Risk  5.0    4.4                                2X Avg.Risk  9.6    7.1                                3X Avg.Risk 23.4   11.0   POCT CBG (Fasting - Glucose)     Status: None   Collection Time: 07/25/23  9:15 AM  Result Value Ref Range   Glucose Fasting, POC 93 70 - 99 mg/dL  POC CREATINE & ALBUMIN,URINE     Status: None   Collection Time: 07/25/23  9:48 AM  Result Value Ref Range   Microalbumin Ur, POC 10 mg/L   Creatinine, POC 50 mg/dL   Albumin/Creatinine Ratio, Urine, POC <30       Assessment & Plan:  As per problem list. Add SGLT-2 for renal protection. Problem List Items Addressed This Visit       Endocrine  Type 2 diabetes mellitus without complication, without long-term current use of insulin (HCC) - Primary   Relevant Medications   metFORMIN (GLUCOPHAGE-XR) 500 MG 24 hr tablet    empagliflozin (JARDIANCE) 10 MG TABS tablet   Other Relevant Orders   POCT CBG (Fasting - Glucose) (Completed)   POC CREATINE & ALBUMIN,URINE (Completed)   Other Visit Diagnoses     Needs flu shot       Relevant Orders   Flu Vaccine Trivalent High Dose (Fluad)       Return in about 3 months (around 10/25/2023) for fu with labs prior.   Total time spent: 20 minutes  Luna Fuse, MD  07/25/2023   This document may have been prepared by [facility] Voice Recognition software and as such may include unintentional dictation errors.

## 2023-08-23 ENCOUNTER — Other Ambulatory Visit: Payer: Self-pay | Admitting: Cardiovascular Disease

## 2023-08-23 DIAGNOSIS — I10 Essential (primary) hypertension: Secondary | ICD-10-CM

## 2023-08-23 DIAGNOSIS — I2510 Atherosclerotic heart disease of native coronary artery without angina pectoris: Secondary | ICD-10-CM

## 2023-08-26 ENCOUNTER — Other Ambulatory Visit: Payer: Self-pay | Admitting: Internal Medicine

## 2023-08-26 DIAGNOSIS — I10 Essential (primary) hypertension: Secondary | ICD-10-CM

## 2023-09-22 ENCOUNTER — Other Ambulatory Visit: Payer: Self-pay | Admitting: Internal Medicine

## 2023-09-22 ENCOUNTER — Other Ambulatory Visit: Payer: Self-pay | Admitting: Cardiovascular Disease

## 2023-09-22 DIAGNOSIS — R079 Chest pain, unspecified: Secondary | ICD-10-CM

## 2023-09-28 ENCOUNTER — Other Ambulatory Visit: Payer: Self-pay | Admitting: Internal Medicine

## 2023-10-17 ENCOUNTER — Other Ambulatory Visit: Payer: Self-pay | Admitting: Internal Medicine

## 2023-10-22 ENCOUNTER — Other Ambulatory Visit: Payer: Self-pay | Admitting: Internal Medicine

## 2023-10-22 DIAGNOSIS — E119 Type 2 diabetes mellitus without complications: Secondary | ICD-10-CM

## 2023-10-24 ENCOUNTER — Other Ambulatory Visit (INDEPENDENT_AMBULATORY_CARE_PROVIDER_SITE_OTHER): Payer: Medicare Other

## 2023-10-24 DIAGNOSIS — E119 Type 2 diabetes mellitus without complications: Secondary | ICD-10-CM

## 2023-10-24 DIAGNOSIS — E7800 Pure hypercholesterolemia, unspecified: Secondary | ICD-10-CM

## 2023-10-25 LAB — LIPID PANEL
Chol/HDL Ratio: 2.6 {ratio} (ref 0.0–5.0)
Cholesterol, Total: 103 mg/dL (ref 100–199)
HDL: 39 mg/dL — ABNORMAL LOW (ref >39)
LDL Chol Calc (NIH): 49 mg/dL (ref 0–99)
Triglycerides: 71 mg/dL (ref 0–149)
VLDL Cholesterol Cal: 15 mg/dL (ref 5–40)

## 2023-10-25 LAB — HEMOGLOBIN A1C
Est. average glucose Bld gHb Est-mCnc: 137 mg/dL
Hgb A1c MFr Bld: 6.4 % — ABNORMAL HIGH (ref 4.8–5.6)

## 2023-10-31 ENCOUNTER — Ambulatory Visit: Payer: Medicare Other | Admitting: Internal Medicine

## 2023-11-07 ENCOUNTER — Ambulatory Visit: Payer: Medicare Other | Admitting: Internal Medicine

## 2023-11-14 ENCOUNTER — Encounter: Payer: Self-pay | Admitting: Internal Medicine

## 2023-11-14 ENCOUNTER — Ambulatory Visit (INDEPENDENT_AMBULATORY_CARE_PROVIDER_SITE_OTHER): Payer: Medicare Other | Admitting: Internal Medicine

## 2023-11-14 VITALS — BP 118/68 | HR 59 | Temp 98.3°F | Ht 74.0 in | Wt 287.0 lb

## 2023-11-14 DIAGNOSIS — I10 Essential (primary) hypertension: Secondary | ICD-10-CM | POA: Diagnosis not present

## 2023-11-14 DIAGNOSIS — N3941 Urge incontinence: Secondary | ICD-10-CM | POA: Diagnosis not present

## 2023-11-14 DIAGNOSIS — E119 Type 2 diabetes mellitus without complications: Secondary | ICD-10-CM

## 2023-11-14 DIAGNOSIS — E7800 Pure hypercholesterolemia, unspecified: Secondary | ICD-10-CM

## 2023-11-14 DIAGNOSIS — E782 Mixed hyperlipidemia: Secondary | ICD-10-CM | POA: Diagnosis not present

## 2023-11-14 LAB — POCT CBG (FASTING - GLUCOSE)-MANUAL ENTRY: Glucose Fasting, POC: 94 mg/dL (ref 70–99)

## 2023-11-14 MED ORDER — EZETIMIBE 10 MG PO TABS: 10.0000 mg | ORAL_TABLET | Freq: Every day | ORAL | 1 refills | Status: DC

## 2023-11-14 MED ORDER — METOPROLOL TARTRATE 25 MG PO TABS: 25.0000 mg | ORAL_TABLET | Freq: Two times a day (BID) | ORAL | 1 refills | Status: DC

## 2023-11-14 MED ORDER — OXYBUTYNIN CHLORIDE 5 MG PO TABS: 5.0000 mg | ORAL_TABLET | Freq: Two times a day (BID) | ORAL | 1 refills | Status: AC

## 2023-11-14 NOTE — Progress Notes (Signed)
 Established Patient Office Visit  Subjective:  Patient ID: Curtis Ballard, male    DOB: 02-17-1951  Age: 73 y.o. MRN: [id number]  Chief Complaint  Patient presents with   Follow-up    3 month follow up, discuss lab results.    No new complaints, here for lab review and medication refills. Labs reviewed and notable for well controlled diabetes, A1c at target and lipids also at target. Denies any hypoglycemic episodes and home bg readings have been at target.     No other concerns at this time.   Past Medical History:  Diagnosis Date   AAA (abdominal aortic aneurysm) (HCC)    AAA (abdominal aortic aneurysm) (HCC)    Hypertension     Past Surgical History:  Procedure Laterality Date   ABDOMINAL AORTIC ENDOVASCULAR STENT GRAFT N/A 08/06/2014   Procedure: Abdominal Aortic Endovascular Stent Graft; Embolization and Coiling of Right and Left Internal Iliac Artery;  Surgeon: Pryor Ochoa, MD;  Location: [facility] OR;  Service: Vascular;  Laterality: N/A;   ABDOMINAL SURGERY     CARDIAC CATHETERIZATION Left 05/17/2015   Procedure: Left Heart Cath and Coronary Angiography;  Surgeon: Laurier Nancy, MD;  Location: [facility] INVASIVE CV LAB;  Service: Cardiovascular;  Laterality: Left;   CARDIAC CATHETERIZATION N/A 05/17/2015   Procedure: Coronary Stent Intervention;  Surgeon: Alwyn Pea, MD;  Location: [facility] INVASIVE CV LAB;  Service: Cardiovascular;  Laterality: N/A;    Social History   Socioeconomic History   Marital status: Married    Spouse name: Not on file   Number of children: Not on file   Years of education: Not on file   Highest education level: Not on file  Occupational History   Not on file  Tobacco Use   Smoking status: Former    Current packs/day: 0.50    Average packs/day: 0.5 packs/day for 20.0 years (10.0 ttl pk-yrs)    Types: Cigarettes   Smokeless tobacco: Not on file  Substance and Sexual Activity   Alcohol use: Yes    Alcohol/week: 2.0 standard drinks of  alcohol    Types: 2 Cans of beer per week   Drug use: No    Types: Marijuana   Sexual activity: Yes    Partners: Male  Other Topics Concern   Not on file  Social History Narrative   Not on file   Social Drivers of Health   Financial Resource Strain: Not on file  Food Insecurity: Not on file  Transportation Needs: Not on file  Physical Activity: Not on file  Stress: Not on file  Social Connections: Not on file  Intimate Partner Violence: Not on file    Family History  Problem Relation Age of Onset   Cancer Mother    Heart disease Father     No Known Allergies  Outpatient Medications Prior to Visit  Medication Sig   amLODipine (NORVASC) 10 MG tablet TAKE 1 TABLET BY MOUTH EVERY MORNING   aspirin 81 MG chewable tablet Chew 1 tablet (81 mg total) by mouth daily.   atorvastatin (LIPITOR) 80 MG tablet TAKE 1 TABLET(80 MG) BY MOUTH EVERY EVENING   clopidogrel (PLAVIX) 75 MG tablet TAKE 1 TABLET BY MOUTH EVERY DAY   dutasteride (AVODART) 0.5 MG capsule TAKE 1 CAPSULE BY MOUTH EVERY MORNING   hydrALAZINE (APRESOLINE) 50 MG tablet TAKE 1 TABLET BY MOUTH THREE TIMES DAILY   isosorbide mononitrate (IMDUR) 30 MG 24 hr tablet TAKE 1 TABLET BY MOUTH EVERY DAY  metFORMIN (GLUCOPHAGE-XR) 500 MG 24 hr tablet TAKE 1 TABLET(500 MG) BY MOUTH EVERY MORNING   olmesartan (BENICAR) 20 MG tablet TAKE 1 TABLET(20 MG) BY MOUTH EVERY MORNING   [DISCONTINUED] ezetimibe (ZETIA) 10 MG tablet TAKE 1 TABLET BY MOUTH EVERY DAY   [DISCONTINUED] metoprolol tartrate (LOPRESSOR) 25 MG tablet TAKE 1 TABLET BY MOUTH TWICE DAILY   [DISCONTINUED] oxybutynin (DITROPAN) 5 MG tablet Take 1 tablet (5 mg total) by mouth 2 (two) times daily.   No facility-administered medications prior to visit.    Review of Systems  Constitutional: Negative.  Negative for weight loss (gained 4 lbs).  HENT: Negative.    Eyes: Negative.   Respiratory: Negative.    Cardiovascular: Negative.   Gastrointestinal: Negative.    Genitourinary:        Nocturia  Musculoskeletal:  Positive for joint pain.       Right ankle pain  Skin: Negative.   Neurological: Negative.   Endo/Heme/Allergies: Negative.  Negative for environmental allergies.  Psychiatric/Behavioral:  The patient does not have insomnia.        Objective:   BP 118/68   Pulse (!) 59   Temp 98.3 F (36.8 C) (Tympanic)   Ht 6\' 2"  (1.88 m)   Wt 287 lb (130.2 kg)   SpO2 (!) 89%   BMI 36.85 kg/m   Vitals:   11/14/23 1020  BP: 118/68  Pulse: (!) 59  Temp: 98.3 F (36.8 C)  Height: 6\' 2"  (1.88 m)  Weight: 287 lb (130.2 kg)  SpO2: (!) 89%  TempSrc: Tympanic  BMI (Calculated): 36.83    Physical Exam Vitals reviewed.  Constitutional:      General: He is not in acute distress.    Appearance: He is obese.  HENT:     Head: Normocephalic.     Left Ear: There is no impacted cerumen.     Nose: Nose normal.     Mouth/Throat:     Mouth: Mucous membranes are moist.     Pharynx: No posterior oropharyngeal erythema.  Eyes:     Extraocular Movements: Extraocular movements intact.     Pupils: Pupils are equal, round, and reactive to light.  Neck:     Vascular: No carotid bruit.  Cardiovascular:     Rate and Rhythm: Regular rhythm.     Chest Wall: PMI is not displaced.     Pulses: Normal pulses.     Heart sounds: Normal heart sounds. No murmur heard. Pulmonary:     Effort: Pulmonary effort is normal.     Breath sounds: Normal air entry. No rhonchi or rales.  Abdominal:     General: Abdomen is flat. Bowel sounds are normal. There is no distension.     Palpations: Abdomen is soft. There is no hepatomegaly, splenomegaly or mass.     Tenderness: There is no abdominal tenderness.  Musculoskeletal:        General: Normal range of motion.     Cervical back: Normal range of motion and neck supple.     Right lower leg: No edema.     Left lower leg: No edema.     Comments: Right ankle brace  Lymphadenopathy:     Cervical: No cervical  adenopathy.  Skin:    General: Skin is warm and dry.     Comments: Healed burns both forearms  Neurological:     General: No focal deficit present.     Mental Status: He is alert and oriented to person, place, and time.  Cranial Nerves: No cranial nerve deficit.     Motor: No weakness.  Psychiatric:        Mood and Affect: Mood normal.        Behavior: Behavior normal.      Results for orders placed or performed in visit on 11/14/23  POCT CBG (Fasting - Glucose)  Result Value Ref Range   Glucose Fasting, POC 94 70 - 99 mg/dL    Recent Results (from the past 2160 hours)  Lipid panel     Status: Abnormal   Collection Time: 10/24/23  8:42 AM  Result Value Ref Range   Cholesterol, Total 103 100 - 199 mg/dL   Triglycerides 71 0 - 149 mg/dL   HDL 39 (L) >62 mg/dL   VLDL Cholesterol Cal 15 5 - 40 mg/dL   LDL Chol Calc (NIH) 49 0 - 99 mg/dL   Chol/HDL Ratio 2.6 0.0 - 5.0 ratio    Comment:                                   T. Chol/HDL Ratio                                             Men  Women                               1/2 Avg.Risk  3.4    3.3                                   Avg.Risk  5.0    4.4                                2X Avg.Risk  9.6    7.1                                3X Avg.Risk 23.4   11.0   Hemoglobin A1c     Status: Abnormal   Collection Time: 10/24/23  8:42 AM  Result Value Ref Range   Hgb A1c MFr Bld 6.4 (H) 4.8 - 5.6 %    Comment:          Prediabetes: 5.7 - 6.4          Diabetes: >6.4          Glycemic control for adults with diabetes: <7.0    Est. average glucose Bld gHb Est-mCnc 137 mg/dL  POCT CBG (Fasting - Glucose)     Status: None   Collection Time: 11/14/23 10:24 AM  Result Value Ref Range   Glucose Fasting, POC 94 70 - 99 mg/dL      Assessment & Plan:  As per problem list  Problem List Items Addressed This Visit       Endocrine   Type 2 diabetes mellitus without complication, without long-term current use of insulin (HCC) -  Primary   Relevant Orders   POCT CBG (Fasting - Glucose) (Completed)   Other Visit Diagnoses       Urge incontinence  Relevant Medications   oxybutynin (DITROPAN) 5 MG tablet     Pure hypercholesterolemia       Relevant Medications   ezetimibe (ZETIA) 10 MG tablet   metoprolol tartrate (LOPRESSOR) 25 MG tablet   Other Relevant Orders   Hepatic function panel   CK     Primary hypertension       Relevant Medications   ezetimibe (ZETIA) 10 MG tablet   metoprolol tartrate (LOPRESSOR) 25 MG tablet   Other Relevant Orders   PSA   CBC With Diff/Platelet       Return in about 3 months (around 02/11/2024) for awv with labs prior.   Total time spent: 20 minutes  Luna Fuse, MD  11/14/2023   This document may have been prepared by [facility] Voice Recognition software and as such may include unintentional dictation errors.

## 2023-11-24 ENCOUNTER — Other Ambulatory Visit: Payer: Self-pay | Admitting: Cardiovascular Disease

## 2023-11-24 DIAGNOSIS — I2510 Atherosclerotic heart disease of native coronary artery without angina pectoris: Secondary | ICD-10-CM

## 2023-11-24 DIAGNOSIS — I10 Essential (primary) hypertension: Secondary | ICD-10-CM

## 2023-12-24 ENCOUNTER — Other Ambulatory Visit: Payer: Self-pay | Admitting: Cardiovascular Disease

## 2023-12-24 DIAGNOSIS — R079 Chest pain, unspecified: Secondary | ICD-10-CM

## 2023-12-27 ENCOUNTER — Other Ambulatory Visit: Payer: Self-pay | Admitting: Internal Medicine

## 2024-01-21 ENCOUNTER — Other Ambulatory Visit: Payer: Self-pay | Admitting: Internal Medicine

## 2024-01-21 DIAGNOSIS — E119 Type 2 diabetes mellitus without complications: Secondary | ICD-10-CM

## 2024-01-27 ENCOUNTER — Other Ambulatory Visit: Payer: Self-pay | Admitting: Internal Medicine

## 2024-02-14 ENCOUNTER — Other Ambulatory Visit: Payer: Self-pay | Admitting: Internal Medicine

## 2024-02-14 ENCOUNTER — Other Ambulatory Visit

## 2024-02-14 DIAGNOSIS — E119 Type 2 diabetes mellitus without complications: Secondary | ICD-10-CM | POA: Diagnosis not present

## 2024-02-14 DIAGNOSIS — I10 Essential (primary) hypertension: Secondary | ICD-10-CM | POA: Diagnosis not present

## 2024-02-14 DIAGNOSIS — E7800 Pure hypercholesterolemia, unspecified: Secondary | ICD-10-CM | POA: Diagnosis not present

## 2024-02-15 LAB — LIPID PANEL
Chol/HDL Ratio: 2.9 ratio (ref 0.0–5.0)
Cholesterol, Total: 109 mg/dL (ref 100–199)
HDL: 38 mg/dL — ABNORMAL LOW (ref >39)
LDL Chol Calc (NIH): 56 mg/dL (ref 0–99)
Triglycerides: 70 mg/dL (ref 0–149)
VLDL Cholesterol Cal: 15 mg/dL (ref 5–40)

## 2024-02-15 LAB — CBC WITH DIFF/PLATELET
Basophils Absolute: 0.1 10*3/uL (ref 0.0–0.2)
Basos: 1 %
EOS (ABSOLUTE): 0.2 10*3/uL (ref 0.0–0.4)
Eos: 3 %
Hematocrit: 50.3 % (ref 37.5–51.0)
Hemoglobin: 16.4 g/dL (ref 13.0–17.7)
Immature Grans (Abs): 0 10*3/uL (ref 0.0–0.1)
Immature Granulocytes: 0 %
Lymphocytes Absolute: 1.7 10*3/uL (ref 0.7–3.1)
Lymphs: 33 %
MCH: 31.2 pg (ref 26.6–33.0)
MCHC: 32.6 g/dL (ref 31.5–35.7)
MCV: 96 fL (ref 79–97)
Monocytes Absolute: 0.4 10*3/uL (ref 0.1–0.9)
Monocytes: 8 %
Neutrophils Absolute: 2.9 10*3/uL (ref 1.4–7.0)
Neutrophils: 55 %
Platelets: 229 10*3/uL (ref 150–450)
RBC: 5.25 x10E6/uL (ref 4.14–5.80)
RDW: 13.4 % (ref 11.6–15.4)
WBC: 5.3 10*3/uL (ref 3.4–10.8)

## 2024-02-15 LAB — HEPATIC FUNCTION PANEL
ALT: 30 IU/L (ref 0–44)
AST: 27 IU/L (ref 0–40)
Albumin: 4.2 g/dL (ref 3.8–4.8)
Alkaline Phosphatase: 84 IU/L (ref 44–121)
Bilirubin Total: 0.7 mg/dL (ref 0.0–1.2)
Bilirubin, Direct: 0.23 mg/dL (ref 0.00–0.40)
Total Protein: 7.1 g/dL (ref 6.0–8.5)

## 2024-02-15 LAB — HEMOGLOBIN A1C
Est. average glucose Bld gHb Est-mCnc: 126 mg/dL
Hgb A1c MFr Bld: 6 % — ABNORMAL HIGH (ref 4.8–5.6)

## 2024-02-15 LAB — PSA: Prostate Specific Ag, Serum: 0.3 ng/mL (ref 0.0–4.0)

## 2024-02-20 ENCOUNTER — Encounter: Payer: Self-pay | Admitting: Internal Medicine

## 2024-02-20 ENCOUNTER — Ambulatory Visit: Payer: Self-pay | Admitting: Internal Medicine

## 2024-02-20 ENCOUNTER — Ambulatory Visit (INDEPENDENT_AMBULATORY_CARE_PROVIDER_SITE_OTHER): Payer: Medicare Other | Admitting: Internal Medicine

## 2024-02-20 VITALS — BP 120/80 | HR 56 | Temp 96.4°F | Ht 74.0 in | Wt 278.2 lb

## 2024-02-20 DIAGNOSIS — Z0000 Encounter for general adult medical examination without abnormal findings: Secondary | ICD-10-CM

## 2024-02-20 DIAGNOSIS — Z0001 Encounter for general adult medical examination with abnormal findings: Secondary | ICD-10-CM

## 2024-02-20 DIAGNOSIS — Z1331 Encounter for screening for depression: Secondary | ICD-10-CM | POA: Diagnosis not present

## 2024-02-20 DIAGNOSIS — E119 Type 2 diabetes mellitus without complications: Secondary | ICD-10-CM | POA: Diagnosis not present

## 2024-02-20 DIAGNOSIS — I10 Essential (primary) hypertension: Secondary | ICD-10-CM

## 2024-02-20 LAB — GLUCOSE, POCT (MANUAL RESULT ENTRY): POC Glucose: 88 mg/dL (ref 70–99)

## 2024-02-20 MED ORDER — OLMESARTAN MEDOXOMIL 20 MG PO TABS: 20.0000 mg | ORAL_TABLET | Freq: Every day | ORAL | 1 refills | Status: DC

## 2024-02-20 NOTE — Progress Notes (Signed)
 Established Patient Office Visit  Subjective:  Patient ID: Curtis Ballard, male    DOB: 23-Jul-1951  Age: 73 y.o. MRN: [id number]  Chief Complaint  Patient presents with   Annual Exam    AWV    No new complaints, here for AWV refer to quality metrics and scanned documents. Labs reviewed and notable for well controlled diabetes, A1c improved and remains at target, lipids at target with unremarkable cmp, cbc and psa. Denies any hypoglycemic episodes and home bg readings have been at target.      No other concerns at this time.   Past Medical History:  Diagnosis Date   AAA (abdominal aortic aneurysm) (HCC)    AAA (abdominal aortic aneurysm) (HCC)    Hypertension     Past Surgical History:  Procedure Laterality Date   ABDOMINAL AORTIC ENDOVASCULAR STENT GRAFT N/A 08/06/2014   Procedure: Abdominal Aortic Endovascular Stent Graft; Embolization and Coiling of Right and Left Internal Iliac Artery;  Surgeon: Palma Bob, MD;  Location: [facility] OR;  Service: Vascular;  Laterality: N/A;   ABDOMINAL SURGERY     CARDIAC CATHETERIZATION Left 05/17/2015   Procedure: Left Heart Cath and Coronary Angiography;  Surgeon: Cherrie Cornwall, MD;  Location: [facility] INVASIVE CV LAB;  Service: Cardiovascular;  Laterality: Left;   CARDIAC CATHETERIZATION N/A 05/17/2015   Procedure: Coronary Stent Intervention;  Surgeon: Antonette Batters, MD;  Location: [facility] INVASIVE CV LAB;  Service: Cardiovascular;  Laterality: N/A;    Social History   Socioeconomic History   Marital status: Married    Spouse name: Not on file   Number of children: Not on file   Years of education: Not on file   Highest education level: Not on file  Occupational History   Not on file  Tobacco Use   Smoking status: Former    Current packs/day: 0.50    Average packs/day: 0.5 packs/day for 20.0 years (10.0 ttl pk-yrs)    Types: Cigarettes   Smokeless tobacco: Not on file  Substance and Sexual Activity   Alcohol use: Yes     Alcohol/week: 2.0 standard drinks of alcohol    Types: 2 Cans of beer per week   Drug use: No    Types: Marijuana   Sexual activity: Yes    Partners: Male  Other Topics Concern   Not on file  Social History Narrative   Not on file   Social Drivers of Health   Financial Resource Strain: Not on file  Food Insecurity: Not on file  Transportation Needs: Not on file  Physical Activity: Not on file  Stress: Not on file  Social Connections: Not on file  Intimate Partner Violence: Not on file    Family History  Problem Relation Age of Onset   Cancer Mother    Heart disease Father     No Known Allergies  Outpatient Medications Prior to Visit  Medication Sig   amLODipine  (NORVASC ) 10 MG tablet TAKE 1 TABLET BY MOUTH EVERY MORNING   aspirin  81 MG chewable tablet Chew 1 tablet (81 mg total) by mouth daily.   atorvastatin  (LIPITOR) 80 MG tablet TAKE 1 TABLET(80 MG) BY MOUTH EVERY EVENING   clopidogrel  (PLAVIX ) 75 MG tablet TAKE 1 TABLET BY MOUTH EVERY DAY   dutasteride (AVODART) 0.5 MG capsule TAKE 1 CAPSULE BY MOUTH EVERY MORNING   ezetimibe  (ZETIA ) 10 MG tablet Take 1 tablet (10 mg total) by mouth daily.   hydrALAZINE  (APRESOLINE ) 50 MG tablet TAKE 1 TABLET BY MOUTH  THREE TIMES DAILY   isosorbide  mononitrate (IMDUR ) 30 MG 24 hr tablet TAKE 1 TABLET BY MOUTH EVERY DAY   metFORMIN  (GLUCOPHAGE -XR) 500 MG 24 hr tablet TAKE 1 TABLET(500 MG) BY MOUTH EVERY MORNING   metoprolol  tartrate (LOPRESSOR ) 25 MG tablet Take 1 tablet (25 mg total) by mouth 2 (two) times daily.   olmesartan  (BENICAR ) 20 MG tablet TAKE 1 TABLET(20 MG) BY MOUTH EVERY MORNING   oxybutynin  (DITROPAN ) 5 MG tablet Take 1 tablet (5 mg total) by mouth 2 (two) times daily.   No facility-administered medications prior to visit.    Review of Systems  Constitutional:  Positive for weight loss (9 lbs).  HENT: Negative.    Eyes: Negative.   Respiratory: Negative.    Cardiovascular: Negative.   Gastrointestinal: Negative.    Genitourinary:        Nocturia  Musculoskeletal:  Positive for joint pain.       Right ankle pain  Skin: Negative.   Neurological: Negative.   Endo/Heme/Allergies: Negative.  Negative for environmental allergies.  Psychiatric/Behavioral:  The patient does not have insomnia.        Objective:   BP 120/80   Pulse (!) 56   Temp (!) 96.4 F (35.8 C)   Ht 6\' 2"  (1.88 m)   Wt 278 lb 3.2 oz (126.2 kg)   SpO2 96%   BMI 35.72 kg/m   Vitals:   02/20/24 0851  BP: 120/80  Pulse: (!) 56  Temp: (!) 96.4 F (35.8 C)  Height: 6\' 2"  (1.88 m)  Weight: 278 lb 3.2 oz (126.2 kg)  SpO2: 96%  BMI (Calculated): 35.7    Physical Exam Vitals reviewed.  Constitutional:      General: He is not in acute distress.    Appearance: He is obese.  HENT:     Head: Normocephalic.     Left Ear: There is no impacted cerumen.     Nose: Nose normal.     Mouth/Throat:     Mouth: Mucous membranes are moist.     Pharynx: No posterior oropharyngeal erythema.  Eyes:     Extraocular Movements: Extraocular movements intact.     Pupils: Pupils are equal, round, and reactive to light.  Neck:     Vascular: No carotid bruit.  Cardiovascular:     Rate and Rhythm: Regular rhythm.     Chest Wall: PMI is not displaced.     Pulses: Normal pulses.     Heart sounds: Normal heart sounds. No murmur heard. Pulmonary:     Effort: Pulmonary effort is normal.     Breath sounds: Normal air entry. No rhonchi or rales.  Abdominal:     General: Abdomen is flat. Bowel sounds are normal. There is no distension.     Palpations: Abdomen is soft. There is no hepatomegaly, splenomegaly or mass.     Tenderness: There is no abdominal tenderness.  Musculoskeletal:        General: Normal range of motion.     Cervical back: Normal range of motion and neck supple.     Right lower leg: No edema.     Left lower leg: No edema.     Comments: Right ankle brace  Lymphadenopathy:     Cervical: No cervical adenopathy.  Skin:     General: Skin is warm and dry.     Comments: Healed burns both forearms  Neurological:     General: No focal deficit present.     Mental Status: He is  alert and oriented to person, place, and time.     Cranial Nerves: No cranial nerve deficit.     Motor: No weakness.  Psychiatric:        Mood and Affect: Mood normal.        Behavior: Behavior normal.      Results for orders placed or performed in visit on 02/20/24  POCT Glucose (CBG)  Result Value Ref Range   POC Glucose 88 70 - 99 mg/dl    Recent Results (from the past 2160 hours)  CBC With Diff/Platelet     Status: None   Collection Time: 02/14/24  8:52 AM  Result Value Ref Range   WBC 5.3 3.4 - 10.8 x10E3/uL   RBC 5.25 4.14 - 5.80 x10E6/uL   Hemoglobin 16.4 13.0 - 17.7 g/dL   Hematocrit 74.2 59.5 - 51.0 %   MCV 96 79 - 97 fL   MCH 31.2 26.6 - 33.0 pg   MCHC 32.6 31.5 - 35.7 g/dL   RDW 63.8 75.6 - 43.3 %   Platelets 229 150 - 450 x10E3/uL   Neutrophils 55 Not Estab. %   Lymphs 33 Not Estab. %   Monocytes 8 Not Estab. %   Eos 3 Not Estab. %   Basos 1 Not Estab. %   Neutrophils Absolute 2.9 1.4 - 7.0 x10E3/uL   Lymphocytes Absolute 1.7 0.7 - 3.1 x10E3/uL   Monocytes Absolute 0.4 0.1 - 0.9 x10E3/uL   EOS (ABSOLUTE) 0.2 0.0 - 0.4 x10E3/uL   Basophils Absolute 0.1 0.0 - 0.2 x10E3/uL   Immature Granulocytes 0 Not Estab. %   Immature Grans (Abs) 0.0 0.0 - 0.1 x10E3/uL  Lipid panel     Status: Abnormal   Collection Time: 02/14/24  8:52 AM  Result Value Ref Range   Cholesterol, Total 109 100 - 199 mg/dL   Triglycerides 70 0 - 149 mg/dL   HDL 38 (L) >29 mg/dL   VLDL Cholesterol Cal 15 5 - 40 mg/dL   LDL Chol Calc (NIH) 56 0 - 99 mg/dL   Chol/HDL Ratio 2.9 0.0 - 5.0 ratio    Comment:                                   T. Chol/HDL Ratio                                             Men  Women                               1/2 Avg.Risk  3.4    3.3                                   Avg.Risk  5.0    4.4                                 2X Avg.Risk  9.6    7.1                                3X  Avg.Risk 23.4   11.0   Hepatic function panel     Status: None   Collection Time: 02/14/24  8:52 AM  Result Value Ref Range   Total Protein 7.1 6.0 - 8.5 g/dL   Albumin 4.2 3.8 - 4.8 g/dL   Bilirubin Total 0.7 0.0 - 1.2 mg/dL   Bilirubin, Direct 1.61 0.00 - 0.40 mg/dL   Alkaline Phosphatase 84 44 - 121 IU/L   AST 27 0 - 40 IU/L   ALT 30 0 - 44 IU/L  Hemoglobin A1c     Status: Abnormal   Collection Time: 02/14/24  8:52 AM  Result Value Ref Range   Hgb A1c MFr Bld 6.0 (H) 4.8 - 5.6 %    Comment:          Prediabetes: 5.7 - 6.4          Diabetes: >6.4          Glycemic control for adults with diabetes: <7.0    Est. average glucose Bld gHb Est-mCnc 126 mg/dL  PSA     Status: None   Collection Time: 02/14/24  8:52 AM  Result Value Ref Range   Prostate Specific Ag, Serum 0.3 0.0 - 4.0 ng/mL    Comment: Roche ECLIA methodology. According to the American Urological Association, Serum PSA should decrease and remain at undetectable levels after radical prostatectomy. The AUA defines biochemical recurrence as an initial PSA value 0.2 ng/mL or greater followed by a subsequent confirmatory PSA value 0.2 ng/mL or greater. Values obtained with different assay methods or kits cannot be used interchangeably. Results cannot be interpreted as absolute evidence of the presence or absence of malignant disease.   POCT Glucose (CBG)     Status: Normal   Collection Time: 02/20/24  9:02 AM  Result Value Ref Range   POC Glucose 88 70 - 99 mg/dl      Assessment & Plan:  As per problem list.  Problem List Items Addressed This Visit       Endocrine   Type 2 diabetes mellitus without complication, without long-term current use of insulin (HCC)   Relevant Orders   POCT Glucose (CBG) (Completed)   Other Visit Diagnoses       Annual wellness visit    -  Primary       Return in 3 weeks (on 03/12/2024) for memory eval.    Total time spent: 30 minutes  Arzella Bitters, MD  02/20/2024   This document may have been prepared by [facility] Voice Recognition software and as such may include unintentional dictation errors.

## 2024-02-22 ENCOUNTER — Other Ambulatory Visit: Payer: Self-pay | Admitting: Cardiovascular Disease

## 2024-02-22 DIAGNOSIS — I2510 Atherosclerotic heart disease of native coronary artery without angina pectoris: Secondary | ICD-10-CM

## 2024-02-22 DIAGNOSIS — I10 Essential (primary) hypertension: Secondary | ICD-10-CM

## 2024-03-27 ENCOUNTER — Other Ambulatory Visit: Payer: Self-pay | Admitting: Internal Medicine

## 2024-03-27 ENCOUNTER — Other Ambulatory Visit: Payer: Self-pay | Admitting: Cardiovascular Disease

## 2024-03-27 DIAGNOSIS — R079 Chest pain, unspecified: Secondary | ICD-10-CM

## 2024-04-26 ENCOUNTER — Other Ambulatory Visit: Payer: Self-pay | Admitting: Internal Medicine

## 2024-04-26 DIAGNOSIS — E119 Type 2 diabetes mellitus without complications: Secondary | ICD-10-CM

## 2024-05-15 ENCOUNTER — Other Ambulatory Visit

## 2024-05-19 ENCOUNTER — Other Ambulatory Visit: Payer: Self-pay | Admitting: Internal Medicine

## 2024-05-19 DIAGNOSIS — E7800 Pure hypercholesterolemia, unspecified: Secondary | ICD-10-CM

## 2024-05-19 DIAGNOSIS — I10 Essential (primary) hypertension: Secondary | ICD-10-CM

## 2024-05-20 ENCOUNTER — Other Ambulatory Visit

## 2024-05-20 DIAGNOSIS — E7800 Pure hypercholesterolemia, unspecified: Secondary | ICD-10-CM

## 2024-05-21 LAB — CK: Total CK: 362 U/L — ABNORMAL HIGH (ref 41–331)

## 2024-05-23 ENCOUNTER — Ambulatory Visit: Admitting: Internal Medicine

## 2024-05-23 ENCOUNTER — Ambulatory Visit: Payer: Self-pay | Admitting: Internal Medicine

## 2024-05-23 ENCOUNTER — Encounter: Payer: Self-pay | Admitting: Internal Medicine

## 2024-05-23 VITALS — BP 128/78 | HR 55 | Ht 74.0 in | Wt 285.4 lb

## 2024-05-23 DIAGNOSIS — E7800 Pure hypercholesterolemia, unspecified: Secondary | ICD-10-CM | POA: Diagnosis not present

## 2024-05-23 DIAGNOSIS — Z0130 Encounter for examination of blood pressure without abnormal findings: Secondary | ICD-10-CM

## 2024-05-23 DIAGNOSIS — E119 Type 2 diabetes mellitus without complications: Secondary | ICD-10-CM | POA: Diagnosis not present

## 2024-05-23 LAB — POCT CBG (FASTING - GLUCOSE)-MANUAL ENTRY: Glucose Fasting, POC: 102 mg/dL — AB (ref 70–99)

## 2024-05-23 NOTE — Progress Notes (Signed)
 Established Patient Office Visit  Subjective:  Patient ID: Curtis Ballard, male    DOB: 1950-10-03  Age: 73 y.o. MRN: [id number]  Chief Complaint  Patient presents with   Follow-up    3 month lab results    No new complaints, here for lab review and medication refills. Only CK available on lab review which was slightly elevated but denies any muscle aches.    No other concerns at this time.   Past Medical History:  Diagnosis Date   AAA (abdominal aortic aneurysm) (HCC)    AAA (abdominal aortic aneurysm) (HCC)    Hypertension     Past Surgical History:  Procedure Laterality Date   ABDOMINAL AORTIC ENDOVASCULAR STENT GRAFT N/A 08/06/2014   Procedure: Abdominal Aortic Endovascular Stent Graft; Embolization and Coiling of Right and Left Internal Iliac Artery;  Surgeon: Lynwood JONETTA Collum, MD;  Location: [facility] OR;  Service: Vascular;  Laterality: N/A;   ABDOMINAL SURGERY     CARDIAC CATHETERIZATION Left 05/17/2015   Procedure: Left Heart Cath and Coronary Angiography;  Surgeon: Denyse DELENA Bathe, MD;  Location: [facility] INVASIVE CV LAB;  Service: Cardiovascular;  Laterality: Left;   CARDIAC CATHETERIZATION N/A 05/17/2015   Procedure: Coronary Stent Intervention;  Surgeon: Cara JONETTA Lovelace, MD;  Location: [facility] INVASIVE CV LAB;  Service: Cardiovascular;  Laterality: N/A;    Social History   Socioeconomic History   Marital status: Married    Spouse name: Not on file   Number of children: Not on file   Years of education: Not on file   Highest education level: Not on file  Occupational History   Not on file  Tobacco Use   Smoking status: Former    Current packs/day: 0.50    Average packs/day: 0.5 packs/day for 20.0 years (10.0 ttl pk-yrs)    Types: Cigarettes   Smokeless tobacco: Not on file  Substance and Sexual Activity   Alcohol use: Yes    Alcohol/week: 2.0 standard drinks of alcohol    Types: 2 Cans of beer per week   Drug use: No    Types: Marijuana   Sexual activity: Yes     Partners: Male  Other Topics Concern   Not on file  Social History Narrative   Not on file   Social Drivers of Health   Financial Resource Strain: Not on file  Food Insecurity: Not on file  Transportation Needs: Not on file  Physical Activity: Not on file  Stress: Not on file  Social Connections: Not on file  Intimate Partner Violence: Not on file    Family History  Problem Relation Age of Onset   Cancer Mother    Heart disease Father     No Known Allergies  Outpatient Medications Prior to Visit  Medication Sig   amLODipine  (NORVASC ) 10 MG tablet TAKE 1 TABLET BY MOUTH EVERY MORNING   aspirin  81 MG chewable tablet Chew 1 tablet (81 mg total) by mouth daily.   atorvastatin  (LIPITOR) 80 MG tablet TAKE 1 TABLET(80 MG) BY MOUTH EVERY EVENING   clopidogrel  (PLAVIX ) 75 MG tablet TAKE 1 TABLET BY MOUTH EVERY DAY   dutasteride (AVODART) 0.5 MG capsule TAKE 1 CAPSULE BY MOUTH EVERY MORNING   ezetimibe  (ZETIA ) 10 MG tablet TAKE 1 TABLET(10 MG) BY MOUTH DAILY   hydrALAZINE  (APRESOLINE ) 50 MG tablet TAKE 1 TABLET BY MOUTH THREE TIMES DAILY   isosorbide  mononitrate (IMDUR ) 30 MG 24 hr tablet TAKE 1 TABLET BY MOUTH EVERY DAY   metFORMIN  (GLUCOPHAGE -XR) 500  MG 24 hr tablet TAKE 1 TABLET(500 MG) BY MOUTH EVERY MORNING   metoprolol  tartrate (LOPRESSOR ) 25 MG tablet TAKE 1 TABLET(25 MG) BY MOUTH TWICE DAILY   olmesartan  (BENICAR ) 20 MG tablet Take 1 tablet (20 mg total) by mouth daily.   oxybutynin  (DITROPAN ) 5 MG tablet Take 1 tablet (5 mg total) by mouth 2 (two) times daily.   No facility-administered medications prior to visit.    Review of Systems  Constitutional:  Negative for weight loss (gained 7 lbs).  HENT: Negative.    Eyes: Negative.   Respiratory: Negative.    Cardiovascular: Negative.   Gastrointestinal: Negative.   Genitourinary:        Nocturia  Musculoskeletal:  Positive for joint pain.       Right ankle pain  Skin: Negative.   Neurological: Negative.    Endo/Heme/Allergies: Negative.  Negative for environmental allergies.  Psychiatric/Behavioral:  The patient does not have insomnia.        Objective:   BP 128/78   Pulse (!) 55   Ht 6' 2 (1.88 m)   Wt 285 lb 6.4 oz (129.5 kg)   SpO2 94%   BMI 36.64 kg/m   Vitals:   05/23/24 0940  BP: 128/78  Pulse: (!) 55  Height: 6' 2 (1.88 m)  Weight: 285 lb 6.4 oz (129.5 kg)  SpO2: 94%  BMI (Calculated): 36.63    Physical Exam Vitals reviewed.  Constitutional:      General: He is not in acute distress.    Appearance: He is obese.  HENT:     Head: Normocephalic.     Left Ear: There is no impacted cerumen.     Nose: Nose normal.     Mouth/Throat:     Mouth: Mucous membranes are moist.     Pharynx: No posterior oropharyngeal erythema.  Eyes:     Extraocular Movements: Extraocular movements intact.     Pupils: Pupils are equal, round, and reactive to light.  Neck:     Vascular: No carotid bruit.  Cardiovascular:     Rate and Rhythm: Regular rhythm.     Chest Wall: PMI is not displaced.     Pulses: Normal pulses.     Heart sounds: Normal heart sounds. No murmur heard. Pulmonary:     Effort: Pulmonary effort is normal.     Breath sounds: Normal air entry. No rhonchi or rales.  Abdominal:     General: Abdomen is flat. Bowel sounds are normal. There is no distension.     Palpations: Abdomen is soft. There is no hepatomegaly, splenomegaly or mass.     Tenderness: There is no abdominal tenderness.  Musculoskeletal:        General: Normal range of motion.     Cervical back: Normal range of motion and neck supple.     Right lower leg: No edema.     Left lower leg: No edema.     Comments: Right ankle brace  Lymphadenopathy:     Cervical: No cervical adenopathy.  Skin:    General: Skin is warm and dry.     Comments: Healed burns both forearms  Neurological:     General: No focal deficit present.     Mental Status: He is alert and oriented to person, place, and time.      Cranial Nerves: No cranial nerve deficit.     Motor: No weakness.  Psychiatric:        Mood and Affect: Mood normal.  Behavior: Behavior normal.      Results for orders placed or performed in visit on 05/23/24  POCT CBG (Fasting - Glucose)  Result Value Ref Range   Glucose Fasting, POC 102 (A) 70 - 99 mg/dL    Recent Results (from the past 2160 hours)  CK     Status: Abnormal   Collection Time: 05/20/24  9:35 AM  Result Value Ref Range   Total CK 362 (H) 41 - 331 U/L  POCT CBG (Fasting - Glucose)     Status: Abnormal   Collection Time: 05/23/24  9:44 AM  Result Value Ref Range   Glucose Fasting, POC 102 (A) 70 - 99 mg/dL      Assessment & Plan:  Curtis Ballard was seen today for follow-up.  Type 2 diabetes mellitus without complication, without long-term current use of insulin (HCC) -     POCT CBG (Fasting - Glucose) -     Hemoglobin A1c; Standing -     Comprehensive metabolic panel with GFR; Future -     POC CREATINE & ALBUMIN,URINE; Future  Pure hypercholesterolemia -     Lipid panel; Standing    Problem List Items Addressed This Visit       Endocrine   Type 2 diabetes mellitus without complication, without long-term current use of insulin (HCC) - Primary   Relevant Orders   POCT CBG (Fasting - Glucose) (Completed)   Hemoglobin A1c   Comprehensive metabolic panel with GFR   Other Visit Diagnoses       Pure hypercholesterolemia       Relevant Orders   Lipid panel       Return in about 3 months (around 08/22/2024) for fu with labs prior.   Total time spent: 20 minutes  Sherrill Cinderella Perry, MD  05/23/2024   This document may have been prepared by [facility] Voice Recognition software and as such may include unintentional dictation errors.

## 2024-05-24 ENCOUNTER — Other Ambulatory Visit: Payer: Self-pay | Admitting: Cardiovascular Disease

## 2024-05-24 DIAGNOSIS — I10 Essential (primary) hypertension: Secondary | ICD-10-CM

## 2024-05-24 DIAGNOSIS — I2510 Atherosclerotic heart disease of native coronary artery without angina pectoris: Secondary | ICD-10-CM

## 2024-06-28 ENCOUNTER — Other Ambulatory Visit: Payer: Self-pay | Admitting: Cardiology

## 2024-06-28 DIAGNOSIS — R079 Chest pain, unspecified: Secondary | ICD-10-CM

## 2024-07-07 NOTE — Progress Notes (Signed)
 Curtis Ballard                                          MRN: [id number]   07/07/2024   The [facility] Quality Team Specialist reviewed this patient medical record for the purposes of chart review for care gap closure. The following were reviewed: chart review for care gap closure-kidney health evaluation for diabetes:eGFR  and uACR.    [facility] Quality Team

## 2024-07-10 ENCOUNTER — Encounter: Payer: Self-pay | Admitting: Cardiovascular Disease

## 2024-07-10 ENCOUNTER — Ambulatory Visit: Admitting: Cardiovascular Disease

## 2024-07-10 VITALS — BP 134/82 | HR 53 | Ht 74.0 in | Wt 287.0 lb

## 2024-07-10 DIAGNOSIS — I2510 Atherosclerotic heart disease of native coronary artery without angina pectoris: Secondary | ICD-10-CM

## 2024-07-10 DIAGNOSIS — Z9861 Coronary angioplasty status: Secondary | ICD-10-CM | POA: Diagnosis not present

## 2024-07-10 DIAGNOSIS — I7131 Pararenal abdominal aortic aneurysm, ruptured: Secondary | ICD-10-CM | POA: Diagnosis not present

## 2024-07-10 DIAGNOSIS — Z0130 Encounter for examination of blood pressure without abnormal findings: Secondary | ICD-10-CM

## 2024-07-10 DIAGNOSIS — I723 Aneurysm of iliac artery: Secondary | ICD-10-CM | POA: Diagnosis not present

## 2024-07-10 DIAGNOSIS — R0602 Shortness of breath: Secondary | ICD-10-CM

## 2024-07-10 DIAGNOSIS — E119 Type 2 diabetes mellitus without complications: Secondary | ICD-10-CM

## 2024-07-10 DIAGNOSIS — R079 Chest pain, unspecified: Secondary | ICD-10-CM

## 2024-07-10 MED ORDER — ISOSORBIDE MONONITRATE ER 30 MG PO TB24: 30.0000 mg | ORAL_TABLET | Freq: Every day | ORAL | 0 refills | Status: DC

## 2024-07-10 NOTE — Progress Notes (Signed)
 Cardiology Office Note   Date:  07/10/2024   ID:  Curtis Ballard, DOB 26-Oct-1950, MRN [id number]  PCP:  Albina GORMAN Dine, MD  Cardiologist:  Denyse Bathe, MD      History of Present Illness: Curtis Ballard is a 73 y.o. male who presents for  Chief Complaint  Patient presents with   Follow-up    Med refills    No chest pains or SOB.      Past Medical History:  Diagnosis Date   AAA (abdominal aortic aneurysm)    AAA (abdominal aortic aneurysm)    Hypertension      Past Surgical History:  Procedure Laterality Date   ABDOMINAL AORTIC ENDOVASCULAR STENT GRAFT N/A 08/06/2014   Procedure: Abdominal Aortic Endovascular Stent Graft; Embolization and Coiling of Right and Left Internal Iliac Artery;  Surgeon: Lynwood JONETTA Collum, MD;  Location: [facility] OR;  Service: Vascular;  Laterality: N/A;   ABDOMINAL SURGERY     CARDIAC CATHETERIZATION Left 05/17/2015   Procedure: Left Heart Cath and Coronary Angiography;  Surgeon: Denyse DELENA Bathe, MD;  Location: [facility] INVASIVE CV LAB;  Service: Cardiovascular;  Laterality: Left;   CARDIAC CATHETERIZATION N/A 05/17/2015   Procedure: Coronary Stent Intervention;  Surgeon: Cara JONETTA Lovelace, MD;  Location: [facility] INVASIVE CV LAB;  Service: Cardiovascular;  Laterality: N/A;     Current Outpatient Medications  Medication Sig Dispense Refill   amLODipine  (NORVASC ) 10 MG tablet TAKE 1 TABLET BY MOUTH EVERY MORNING 90 tablet 1   aspirin  81 MG chewable tablet Chew 1 tablet (81 mg total) by mouth daily. 30 tablet 0   atorvastatin  (LIPITOR) 80 MG tablet TAKE 1 TABLET(80 MG) BY MOUTH EVERY EVENING 90 tablet 0   clopidogrel  (PLAVIX ) 75 MG tablet TAKE 1 TABLET BY MOUTH EVERY DAY 90 tablet 0   dutasteride (AVODART) 0.5 MG capsule TAKE 1 CAPSULE BY MOUTH EVERY MORNING 90 capsule 1   ezetimibe  (ZETIA ) 10 MG tablet TAKE 1 TABLET(10 MG) BY MOUTH DAILY 90 tablet 1   hydrALAZINE  (APRESOLINE ) 50 MG tablet TAKE 1 TABLET BY MOUTH THREE TIMES DAILY 270 tablet 0   metFORMIN   (GLUCOPHAGE -XR) 500 MG 24 hr tablet TAKE 1 TABLET(500 MG) BY MOUTH EVERY MORNING 90 tablet 0   metoprolol  tartrate (LOPRESSOR ) 25 MG tablet TAKE 1 TABLET(25 MG) BY MOUTH TWICE DAILY 180 tablet 1   olmesartan  (BENICAR ) 20 MG tablet Take 1 tablet (20 mg total) by mouth daily. 90 tablet 1   oxybutynin  (DITROPAN ) 5 MG tablet Take 1 tablet (5 mg total) by mouth 2 (two) times daily. 180 tablet 1   isosorbide  mononitrate (IMDUR ) 30 MG 24 hr tablet Take 1 tablet (30 mg total) by mouth daily. 90 tablet 0   No current facility-administered medications for this visit.    Allergies:   Patient has no known allergies.    Social History:   reports that he has quit smoking. His smoking use included cigarettes. He has a 10 pack-year smoking history. He does not have any smokeless tobacco history on file. He reports current alcohol use of about 2.0 standard drinks of alcohol per week. He reports that he does not use drugs.   Family History:  family history includes Cancer in his mother; Heart disease in his father.    ROS:     Review of Systems  Constitutional: Negative.   HENT: Negative.    Eyes: Negative.   Respiratory: Negative.    Gastrointestinal: Negative.   Genitourinary: Negative.   Musculoskeletal: Negative.  Skin: Negative.   Neurological: Negative.   Endo/Heme/Allergies: Negative.   Psychiatric/Behavioral: Negative.    All other systems reviewed and are negative.     All other systems are reviewed and negative.    PHYSICAL EXAM: VS:  BP 134/82   Pulse (!) 53   Ht 6' 2 (1.88 m)   Wt 287 lb (130.2 kg)   SpO2 93%   BMI 36.85 kg/m  , BMI Body mass index is 36.85 kg/m. Last weight:  Wt Readings from Last 3 Encounters:  07/10/24 287 lb (130.2 kg)  05/23/24 285 lb 6.4 oz (129.5 kg)  02/20/24 278 lb 3.2 oz (126.2 kg)     Physical Exam Vitals reviewed.  Constitutional:      Appearance: Normal appearance. He is normal weight.  HENT:     Head: Normocephalic.     Nose: Nose  normal.     Mouth/Throat:     Mouth: Mucous membranes are moist.  Eyes:     Pupils: Pupils are equal, round, and reactive to light.  Cardiovascular:     Rate and Rhythm: Normal rate and regular rhythm.     Pulses: Normal pulses.     Heart sounds: Normal heart sounds.  Pulmonary:     Effort: Pulmonary effort is normal.  Abdominal:     General: Abdomen is flat. Bowel sounds are normal.  Musculoskeletal:        General: Normal range of motion.     Cervical back: Normal range of motion.  Skin:    General: Skin is warm.  Neurological:     General: No focal deficit present.     Mental Status: He is alert.  Psychiatric:        Mood and Affect: Mood normal.       EKG:   Recent Labs: 07/18/2023: BUN 12; Creatinine, Ser 1.48; Potassium 5.2; Sodium 142 02/14/2024: ALT 30; Hemoglobin 16.4; Platelets 229    Lipid Panel    Component Value Date/Time   CHOL 109 02/14/2024 0852   TRIG 70 02/14/2024 0852   HDL 38 (L) 02/14/2024 0852   CHOLHDL 2.9 02/14/2024 0852   CHOLHDL 7.4 05/15/2015 0700   VLDL 30 05/15/2015 0700   LDLCALC 56 02/14/2024 0852      Other studies Reviewed: Additional studies/ records that were reviewed today include:  Review of the above records demonstrates:       No data to display            ASSESSMENT AND PLAN:    ICD-10-CM   1. CAD S/P percutaneous coronary angioplasty  I25.10 PCV ECHOCARDIOGRAM COMPLETE   Z98.61 MYOCARDIAL PERFUSION IMAGING   PCI of proximal LAD,  2016,advise f/u stress test    2. Chest pain, unspecified type  R07.9 isosorbide  mononitrate (IMDUR ) 30 MG 24 hr tablet    PCV ECHOCARDIOGRAM COMPLETE    MYOCARDIAL PERFUSION IMAGING    3. Ruptured pararenal abdominal aortic aneurysm (AAA) (HCC)  I71.31 PCV ECHOCARDIOGRAM COMPLETE    MYOCARDIAL PERFUSION IMAGING    4. Bilateral iliac artery aneurysm  I72.3 PCV ECHOCARDIOGRAM COMPLETE    MYOCARDIAL PERFUSION IMAGING    5. Type 2 diabetes mellitus without complication, without  long-term current use of insulin (HCC)  E11.9 PCV ECHOCARDIOGRAM COMPLETE    MYOCARDIAL PERFUSION IMAGING    6. Coronary artery disease involving native coronary artery of native heart without angina pectoris  I25.10 PCV ECHOCARDIOGRAM COMPLETE    MYOCARDIAL PERFUSION IMAGING    7. SOB (shortness of breath)  R06.02 PCV ECHOCARDIOGRAM COMPLETE    MYOCARDIAL PERFUSION IMAGING       Problem List Items Addressed This Visit       Cardiovascular and Mediastinum   AAA (abdominal aortic aneurysm, ruptured) (HCC)   Relevant Medications   isosorbide  mononitrate (IMDUR ) 30 MG 24 hr tablet   Other Relevant Orders   PCV ECHOCARDIOGRAM COMPLETE   MYOCARDIAL PERFUSION IMAGING   Bilateral iliac artery aneurysm   Relevant Medications   isosorbide  mononitrate (IMDUR ) 30 MG 24 hr tablet   Other Relevant Orders   PCV ECHOCARDIOGRAM COMPLETE   MYOCARDIAL PERFUSION IMAGING     Endocrine   Type 2 diabetes mellitus without complication, without long-term current use of insulin (HCC)   Relevant Orders   PCV ECHOCARDIOGRAM COMPLETE   MYOCARDIAL PERFUSION IMAGING     Other   Chest pain   Relevant Medications   isosorbide  mononitrate (IMDUR ) 30 MG 24 hr tablet   Other Relevant Orders   PCV ECHOCARDIOGRAM COMPLETE   MYOCARDIAL PERFUSION IMAGING   Other Visit Diagnoses       CAD S/P percutaneous coronary angioplasty    -  Primary   PCI of proximal LAD,  2016,advise f/u stress test   Relevant Medications   isosorbide  mononitrate (IMDUR ) 30 MG 24 hr tablet   Other Relevant Orders   PCV ECHOCARDIOGRAM COMPLETE   MYOCARDIAL PERFUSION IMAGING     Coronary artery disease involving native coronary artery of native heart without angina pectoris       Relevant Medications   isosorbide  mononitrate (IMDUR ) 30 MG 24 hr tablet   Other Relevant Orders   PCV ECHOCARDIOGRAM COMPLETE   MYOCARDIAL PERFUSION IMAGING     SOB (shortness of breath)       Relevant Orders   PCV ECHOCARDIOGRAM COMPLETE    MYOCARDIAL PERFUSION IMAGING          Disposition:   Return in about 4 weeks (around 08/07/2024) for echo, stress test and f/u.    Total time spent: 35 minutes  Signed,  Denyse Bathe, MD  07/10/2024 10:27 AM    [facility]

## 2024-07-27 ENCOUNTER — Other Ambulatory Visit: Payer: Self-pay | Admitting: Internal Medicine

## 2024-07-27 DIAGNOSIS — E119 Type 2 diabetes mellitus without complications: Secondary | ICD-10-CM

## 2024-07-28 ENCOUNTER — Other Ambulatory Visit

## 2024-07-28 DIAGNOSIS — E119 Type 2 diabetes mellitus without complications: Secondary | ICD-10-CM

## 2024-07-28 DIAGNOSIS — E7800 Pure hypercholesterolemia, unspecified: Secondary | ICD-10-CM

## 2024-07-29 LAB — COMPREHENSIVE METABOLIC PANEL WITH GFR
ALT: 33 IU/L (ref 0–44)
AST: 28 IU/L (ref 0–40)
Albumin: 4.3 g/dL (ref 3.8–4.8)
Alkaline Phosphatase: 79 IU/L (ref 47–123)
BUN/Creatinine Ratio: 10 (ref 10–24)
BUN: 15 mg/dL (ref 8–27)
Bilirubin Total: 0.8 mg/dL (ref 0.0–1.2)
CO2: 24 mmol/L (ref 20–29)
Calcium: 9.9 mg/dL (ref 8.6–10.2)
Chloride: 105 mmol/L (ref 96–106)
Creatinine, Ser: 1.56 mg/dL — ABNORMAL HIGH (ref 0.76–1.27)
Globulin, Total: 2.8 g/dL (ref 1.5–4.5)
Glucose: 100 mg/dL — ABNORMAL HIGH (ref 70–99)
Potassium: 5.1 mmol/L (ref 3.5–5.2)
Sodium: 141 mmol/L (ref 134–144)
Total Protein: 7.1 g/dL (ref 6.0–8.5)
eGFR: 47 mL/min/1.73 — ABNORMAL LOW (ref >59)

## 2024-07-29 LAB — HEMOGLOBIN A1C
Est. average glucose Bld gHb Est-mCnc: 128 mg/dL
Hgb A1c MFr Bld: 6.1 % — ABNORMAL HIGH (ref 4.8–5.6)

## 2024-07-29 LAB — LIPID PANEL
Chol/HDL Ratio: 2.9 ratio (ref 0.0–5.0)
Cholesterol, Total: 109 mg/dL (ref 100–199)
HDL: 37 mg/dL — ABNORMAL LOW (ref >39)
LDL Chol Calc (NIH): 59 mg/dL (ref 0–99)
Triglycerides: 59 mg/dL (ref 0–149)
VLDL Cholesterol Cal: 13 mg/dL (ref 5–40)

## 2024-07-30 ENCOUNTER — Ambulatory Visit (INDEPENDENT_AMBULATORY_CARE_PROVIDER_SITE_OTHER): Admitting: Internal Medicine

## 2024-07-30 ENCOUNTER — Encounter: Payer: Self-pay | Admitting: Internal Medicine

## 2024-07-30 ENCOUNTER — Ambulatory Visit: Payer: Self-pay | Admitting: Internal Medicine

## 2024-07-30 VITALS — BP 106/64 | HR 52 | Temp 97.4°F | Ht 74.0 in | Wt 288.2 lb

## 2024-07-30 DIAGNOSIS — E1165 Type 2 diabetes mellitus with hyperglycemia: Secondary | ICD-10-CM | POA: Diagnosis not present

## 2024-07-30 DIAGNOSIS — Z23 Encounter for immunization: Secondary | ICD-10-CM

## 2024-07-30 DIAGNOSIS — E1142 Type 2 diabetes mellitus with diabetic polyneuropathy: Secondary | ICD-10-CM | POA: Insufficient documentation

## 2024-07-30 DIAGNOSIS — I10 Essential (primary) hypertension: Secondary | ICD-10-CM | POA: Insufficient documentation

## 2024-07-30 DIAGNOSIS — E7800 Pure hypercholesterolemia, unspecified: Secondary | ICD-10-CM

## 2024-07-30 DIAGNOSIS — N1831 Chronic kidney disease, stage 3a: Secondary | ICD-10-CM | POA: Insufficient documentation

## 2024-07-30 DIAGNOSIS — E119 Type 2 diabetes mellitus without complications: Secondary | ICD-10-CM

## 2024-07-30 LAB — POCT CBG (FASTING - GLUCOSE)-MANUAL ENTRY: Glucose Fasting, POC: 120 mg/dL — AB (ref 70–99)

## 2024-07-30 LAB — POC CREATINE & ALBUMIN,URINE
Albumin/Creatinine Ratio, Urine, POC: <30
Creatinine, POC: 50 mg/dL
Microalbumin Ur, POC: 10 mg/L

## 2024-07-30 MED ORDER — DAPAGLIFLOZIN PROPANEDIOL 5 MG PO TABS: 5.0000 mg | ORAL_TABLET | Freq: Every day | ORAL | 0 refills | Status: AC

## 2024-07-30 MED ORDER — DAPAGLIFLOZIN PROPANEDIOL 5 MG PO TABS: 5.0000 mg | ORAL_TABLET | Freq: Every day | ORAL | Status: AC

## 2024-07-30 NOTE — Addendum Note (Signed)
 Addended byBETHA ORION STABS on: 07/30/2024 10:16 AM   Modules accepted: Orders

## 2024-07-30 NOTE — Progress Notes (Signed)
 Established Patient Office Visit  Subjective:  Patient ID: Curtis Ballard, male    DOB: 1951/08/28  Age: 73 y.o. MRN: [id number]  Chief Complaint  Patient presents with   Follow-up    3 month follow up, discuss lab results.     No new complaints, here for lab review and medication refills. Labs reviewed and notable for well controlled diabetes, A1c higher but remains at target, lipids at target with cmp notable for slight deterioration in Cr but still in CKD 3 a.     No other concerns at this time.   Past Medical History:  Diagnosis Date   AAA (abdominal aortic aneurysm)    AAA (abdominal aortic aneurysm)    Hypertension     Past Surgical History:  Procedure Laterality Date   ABDOMINAL AORTIC ENDOVASCULAR STENT GRAFT N/A 08/06/2014   Procedure: Abdominal Aortic Endovascular Stent Graft; Embolization and Coiling of Right and Left Internal Iliac Artery;  Surgeon: Lynwood JONETTA Collum, MD;  Location: [facility] OR;  Service: Vascular;  Laterality: N/A;   ABDOMINAL SURGERY     CARDIAC CATHETERIZATION Left 05/17/2015   Procedure: Left Heart Cath and Coronary Angiography;  Surgeon: Denyse DELENA Bathe, MD;  Location: [facility] INVASIVE CV LAB;  Service: Cardiovascular;  Laterality: Left;   CARDIAC CATHETERIZATION N/A 05/17/2015   Procedure: Coronary Stent Intervention;  Surgeon: Cara JONETTA Lovelace, MD;  Location: [facility] INVASIVE CV LAB;  Service: Cardiovascular;  Laterality: N/A;    Social History   Socioeconomic History   Marital status: Married    Spouse name: Not on file   Number of children: Not on file   Years of education: Not on file   Highest education level: Not on file  Occupational History   Not on file  Tobacco Use   Smoking status: Former    Current packs/day: 0.50    Average packs/day: 0.5 packs/day for 20.0 years (10.0 ttl pk-yrs)    Types: Cigarettes   Smokeless tobacco: Not on file  Substance and Sexual Activity   Alcohol use: Yes    Alcohol/week: 2.0 standard drinks of alcohol     Types: 2 Cans of beer per week   Drug use: No    Types: Marijuana   Sexual activity: Yes    Partners: Male  Other Topics Concern   Not on file  Social History Narrative   Not on file   Social Drivers of Health   Financial Resource Strain: Not on file  Food Insecurity: Not on file  Transportation Needs: Not on file  Physical Activity: Not on file  Stress: Not on file  Social Connections: Not on file  Intimate Partner Violence: Not on file    Family History  Problem Relation Age of Onset   Cancer Mother    Heart disease Father     No Known Allergies  Outpatient Medications Prior to Visit  Medication Sig   amLODipine  (NORVASC ) 10 MG tablet TAKE 1 TABLET BY MOUTH EVERY MORNING   aspirin  81 MG chewable tablet Chew 1 tablet (81 mg total) by mouth daily.   atorvastatin  (LIPITOR) 80 MG tablet TAKE 1 TABLET(80 MG) BY MOUTH EVERY EVENING   clopidogrel  (PLAVIX ) 75 MG tablet TAKE 1 TABLET BY MOUTH EVERY DAY   dutasteride (AVODART) 0.5 MG capsule TAKE 1 CAPSULE BY MOUTH EVERY MORNING   ezetimibe  (ZETIA ) 10 MG tablet TAKE 1 TABLET(10 MG) BY MOUTH DAILY   hydrALAZINE  (APRESOLINE ) 50 MG tablet TAKE 1 TABLET BY MOUTH THREE TIMES DAILY   isosorbide   mononitrate (IMDUR ) 30 MG 24 hr tablet Take 1 tablet (30 mg total) by mouth daily.   metFORMIN  (GLUCOPHAGE -XR) 500 MG 24 hr tablet TAKE 1 TABLET(500 MG) BY MOUTH EVERY MORNING   metoprolol  tartrate (LOPRESSOR ) 25 MG tablet TAKE 1 TABLET(25 MG) BY MOUTH TWICE DAILY   olmesartan  (BENICAR ) 20 MG tablet Take 1 tablet (20 mg total) by mouth daily.   oxybutynin  (DITROPAN ) 5 MG tablet Take 1 tablet (5 mg total) by mouth 2 (two) times daily.   No facility-administered medications prior to visit.    ROS     Objective:   BP 106/64   Pulse (!) 52   Temp (!) 97.4 F (36.3 C) (Tympanic)   Ht 6' 2 (1.88 m)   Wt 288 lb 3.2 oz (130.7 kg)   SpO2 95%   BMI 37.00 kg/m   Vitals:   07/30/24 0902  BP: 106/64  Pulse: (!) 52  Temp: (!) 97.4 F  (36.3 C)  Height: 6' 2 (1.88 m)  Weight: 288 lb 3.2 oz (130.7 kg)  SpO2: 95%  TempSrc: Tympanic  BMI (Calculated): 36.99    Physical Exam   Results for orders placed or performed in visit on 07/30/24  POCT CBG (Fasting - Glucose)  Result Value Ref Range   Glucose Fasting, POC 120 (A) 70 - 99 mg/dL  POC CREATINE & ALBUMIN,URINE  Result Value Ref Range   Microalbumin Ur, POC 10 mg/L   Creatinine, POC 50 mg/dL   Albumin/Creatinine Ratio, Urine, POC <30     Recent Results (from the past 2160 hours)  CK     Status: Abnormal   Collection Time: 05/20/24  9:35 AM  Result Value Ref Range   Total CK 362 (H) 41 - 331 U/L  POCT CBG (Fasting - Glucose)     Status: Abnormal   Collection Time: 05/23/24  9:44 AM  Result Value Ref Range   Glucose Fasting, POC 102 (A) 70 - 99 mg/dL  Comprehensive metabolic panel with GFR     Status: Abnormal   Collection Time: 07/28/24 10:34 AM  Result Value Ref Range   Glucose 100 (H) 70 - 99 mg/dL   BUN 15 8 - 27 mg/dL   Creatinine, Ser 8.43 (H) 0.76 - 1.27 mg/dL   eGFR 47 (L) >40 fO/fpw/8.26   BUN/Creatinine Ratio 10 10 - 24   Sodium 141 134 - 144 mmol/L   Potassium 5.1 3.5 - 5.2 mmol/L   Chloride 105 96 - 106 mmol/L   CO2 24 20 - 29 mmol/L   Calcium  9.9 8.6 - 10.2 mg/dL   Total Protein 7.1 6.0 - 8.5 g/dL   Albumin 4.3 3.8 - 4.8 g/dL   Globulin, Total 2.8 1.5 - 4.5 g/dL   Bilirubin Total 0.8 0.0 - 1.2 mg/dL   Alkaline Phosphatase 79 47 - 123 IU/L   AST 28 0 - 40 IU/L   ALT 33 0 - 44 IU/L  Lipid panel     Status: Abnormal   Collection Time: 07/28/24 10:34 AM  Result Value Ref Range   Cholesterol, Total 109 100 - 199 mg/dL   Triglycerides 59 0 - 149 mg/dL   HDL 37 (L) >60 mg/dL   VLDL Cholesterol Cal 13 5 - 40 mg/dL   LDL Chol Calc (NIH) 59 0 - 99 mg/dL   Chol/HDL Ratio 2.9 0.0 - 5.0 ratio    Comment:  T. Chol/HDL Ratio                                             Men  Women                                1/2 Avg.Risk  3.4    3.3                                   Avg.Risk  5.0    4.4                                2X Avg.Risk  9.6    7.1                                3X Avg.Risk 23.4   11.0   Hemoglobin A1c     Status: Abnormal   Collection Time: 07/28/24 10:34 AM  Result Value Ref Range   Hgb A1c MFr Bld 6.1 (H) 4.8 - 5.6 %    Comment:          Prediabetes: 5.7 - 6.4          Diabetes: >6.4          Glycemic control for adults with diabetes: <7.0    Est. average glucose Bld gHb Est-mCnc 128 mg/dL  POCT CBG (Fasting - Glucose)     Status: Abnormal   Collection Time: 07/30/24  9:07 AM  Result Value Ref Range   Glucose Fasting, POC 120 (A) 70 - 99 mg/dL  POC CREATINE & ALBUMIN,URINE     Status: Normal   Collection Time: 07/30/24  9:49 AM  Result Value Ref Range   Microalbumin Ur, POC 10 mg/L   Creatinine, POC 50 mg/dL   Albumin/Creatinine Ratio, Urine, POC <30       Assessment & Plan:  Curtis Ballard was seen today for follow-up.  Diabetic polyneuropathy associated with type 2 diabetes mellitus (HCC)  Type 2 diabetes mellitus without complication, without long-term current use of insulin (HCC) -     POCT CBG (Fasting - Glucose) -     POC CREATINE & ALBUMIN,URINE -     Dapagliflozin Propanediol; Take 1 tablet (5 mg total) by mouth daily before breakfast.  Dispense: 90 tablet; Refill: 0 -     Dapagliflozin Propanediol; Take 1 tablet (5 mg total) by mouth daily before breakfast for 28 days. Or take half of 10 mg tab daily -     Hemoglobin A1c  CKD stage 3a, GFR 45-59 ml/min (HCC) -     Dapagliflozin Propanediol; Take 1 tablet (5 mg total) by mouth daily before breakfast.  Dispense: 90 tablet; Refill: 0 -     Dapagliflozin Propanediol; Take 1 tablet (5 mg total) by mouth daily before breakfast for 28 days. Or take half of 10 mg tab daily  Primary hypertension  Pure hypercholesterolemia -     Lipid panel    Problem List Items Addressed This Visit       Cardiovascular and  Mediastinum   Primary hypertension     Endocrine   Type 2  diabetes mellitus without complication, without long-term current use of insulin (HCC)   Relevant Medications   dapagliflozin propanediol (FARXIGA) 5 MG TABS tablet   dapagliflozin propanediol (FARXIGA) 5 MG TABS tablet   Other Relevant Orders   POCT CBG (Fasting - Glucose) (Completed)   Diabetic polyneuropathy associated with type 2 diabetes mellitus (HCC) - Primary   Relevant Medications   dapagliflozin propanediol (FARXIGA) 5 MG TABS tablet   dapagliflozin propanediol (FARXIGA) 5 MG TABS tablet     Genitourinary   CKD stage 3a, GFR 45-59 ml/min (HCC)   Relevant Medications   dapagliflozin propanediol (FARXIGA) 5 MG TABS tablet   dapagliflozin propanediol (FARXIGA) 5 MG TABS tablet   Other Visit Diagnoses       Pure hypercholesterolemia           Return in about 3 months (around 10/30/2024) for fu with labs prior.   Total time spent: 20 minutes. This time includes review of previous notes and results and patient face to face interaction during today'Tamotsu Wiederholt visit.    Sherrill Cinderella Perry, MD  07/30/2024   This document may have been prepared by [facility] Voice Recognition software and as such may include unintentional dictation errors.

## 2024-08-08 ENCOUNTER — Ambulatory Visit (INDEPENDENT_AMBULATORY_CARE_PROVIDER_SITE_OTHER)

## 2024-08-08 DIAGNOSIS — R079 Chest pain, unspecified: Secondary | ICD-10-CM

## 2024-08-08 DIAGNOSIS — I371 Nonrheumatic pulmonary valve insufficiency: Secondary | ICD-10-CM | POA: Diagnosis not present

## 2024-08-08 DIAGNOSIS — I361 Nonrheumatic tricuspid (valve) insufficiency: Secondary | ICD-10-CM

## 2024-08-08 DIAGNOSIS — E119 Type 2 diabetes mellitus without complications: Secondary | ICD-10-CM

## 2024-08-08 DIAGNOSIS — I7131 Pararenal abdominal aortic aneurysm, ruptured: Secondary | ICD-10-CM

## 2024-08-08 DIAGNOSIS — I723 Aneurysm of iliac artery: Secondary | ICD-10-CM

## 2024-08-08 DIAGNOSIS — R0602 Shortness of breath: Secondary | ICD-10-CM

## 2024-08-08 DIAGNOSIS — I2510 Atherosclerotic heart disease of native coronary artery without angina pectoris: Secondary | ICD-10-CM

## 2024-08-11 NOTE — Progress Notes (Signed)
 Curtis Ballard                                          MRN: [id number]   08/11/2024   The [facility] Quality Team Specialist reviewed this patient medical record for the purposes of chart review for care gap closure. The following were reviewed: chart review for care gap closure-diabetic eye exam.    [facility] Quality Team

## 2024-08-21 ENCOUNTER — Encounter

## 2024-08-23 ENCOUNTER — Other Ambulatory Visit: Payer: Self-pay | Admitting: Cardiovascular Disease

## 2024-08-23 DIAGNOSIS — I10 Essential (primary) hypertension: Secondary | ICD-10-CM

## 2024-08-23 DIAGNOSIS — I2510 Atherosclerotic heart disease of native coronary artery without angina pectoris: Secondary | ICD-10-CM

## 2024-08-26 ENCOUNTER — Other Ambulatory Visit: Payer: Self-pay | Admitting: Internal Medicine

## 2024-08-26 DIAGNOSIS — I10 Essential (primary) hypertension: Secondary | ICD-10-CM

## 2024-09-01 ENCOUNTER — Ambulatory Visit: Admitting: Cardiovascular Disease

## 2024-09-15 ENCOUNTER — Encounter: Payer: Self-pay | Admitting: *Deleted

## 2024-09-15 NOTE — Progress Notes (Signed)
 Curtis Ballard                                          MRN: [id number]   09/15/2024   The [facility] Quality Team Specialist reviewed this patient medical record for the purposes of chart review for care gap closure. The following were reviewed: abstraction for care gap closure-kidney health evaluation for diabetes:eGFR  and uACR.    [facility] Quality Team

## 2024-10-11 ENCOUNTER — Other Ambulatory Visit: Payer: Self-pay | Admitting: Cardiovascular Disease

## 2024-10-11 ENCOUNTER — Other Ambulatory Visit: Payer: Self-pay | Admitting: Internal Medicine

## 2024-10-11 DIAGNOSIS — R079 Chest pain, unspecified: Secondary | ICD-10-CM

## 2024-10-13 NOTE — Progress Notes (Signed)
 Curtis Ballard                                          MRN: [id number]   10/13/2024   The [facility] Quality Team Specialist reviewed this patient medical record for the purposes of chart review for care gap closure. The following were reviewed: abstraction for care gap closure-glycemic status assessment.    [facility] Quality Team

## 2024-10-29 ENCOUNTER — Ambulatory Visit: Admitting: Internal Medicine
